# Patient Record
Sex: Female | Born: 1935 | Race: White | Hispanic: No | Smoking: Never smoker
Health system: Southern US, Community
[De-identification: ages and names within clinical notes are randomized; demographics above are authoritative.]

## PROBLEM LIST (undated history)

## (undated) DIAGNOSIS — F0280 Dementia in other diseases classified elsewhere without behavioral disturbance: Secondary | ICD-10-CM

## (undated) DIAGNOSIS — S32509A Unspecified fracture of unspecified pubis, initial encounter for closed fracture: Secondary | ICD-10-CM

## (undated) DIAGNOSIS — N390 Urinary tract infection, site not specified: Secondary | ICD-10-CM

## (undated) DIAGNOSIS — S43006A Unspecified dislocation of unspecified shoulder joint, initial encounter: Secondary | ICD-10-CM

## (undated) DIAGNOSIS — G309 Alzheimer's disease, unspecified: Secondary | ICD-10-CM

## (undated) DIAGNOSIS — E079 Disorder of thyroid, unspecified: Secondary | ICD-10-CM

## (undated) HISTORY — PX: CHOLECYSTECTOMY: SHX55

---

## 2000-06-18 ENCOUNTER — Emergency Department (HOSPITAL_COMMUNITY): Admission: EM | Admit: 2000-06-18 | Discharge: 2000-06-18 | Payer: Self-pay | Admitting: Emergency Medicine

## 2000-06-18 ENCOUNTER — Encounter: Payer: Self-pay | Admitting: Emergency Medicine

## 2002-04-03 ENCOUNTER — Emergency Department (HOSPITAL_COMMUNITY): Admission: EM | Admit: 2002-04-03 | Discharge: 2002-04-03 | Payer: Self-pay | Admitting: Emergency Medicine

## 2002-04-03 ENCOUNTER — Encounter: Payer: Self-pay | Admitting: Emergency Medicine

## 2003-11-25 ENCOUNTER — Emergency Department (HOSPITAL_COMMUNITY): Admission: EM | Admit: 2003-11-25 | Discharge: 2003-11-25 | Payer: Self-pay | Admitting: Emergency Medicine

## 2004-06-26 ENCOUNTER — Emergency Department (HOSPITAL_COMMUNITY): Admission: EM | Admit: 2004-06-26 | Discharge: 2004-06-26 | Payer: Self-pay | Admitting: Emergency Medicine

## 2009-11-08 ENCOUNTER — Emergency Department (HOSPITAL_BASED_OUTPATIENT_CLINIC_OR_DEPARTMENT_OTHER): Admission: EM | Admit: 2009-11-08 | Discharge: 2009-11-08 | Payer: Self-pay | Admitting: Emergency Medicine

## 2009-11-08 ENCOUNTER — Ambulatory Visit: Payer: Self-pay | Admitting: Radiology

## 2012-01-12 ENCOUNTER — Emergency Department (HOSPITAL_BASED_OUTPATIENT_CLINIC_OR_DEPARTMENT_OTHER): Payer: Medicare Other

## 2012-01-12 ENCOUNTER — Emergency Department (HOSPITAL_BASED_OUTPATIENT_CLINIC_OR_DEPARTMENT_OTHER)
Admission: EM | Admit: 2012-01-12 | Discharge: 2012-01-12 | Disposition: A | Discharge status: Home / Self Care | Payer: Medicare Other | Attending: Emergency Medicine | Admitting: Emergency Medicine

## 2012-01-12 ENCOUNTER — Encounter (HOSPITAL_BASED_OUTPATIENT_CLINIC_OR_DEPARTMENT_OTHER): Payer: Self-pay

## 2012-01-12 DIAGNOSIS — G309 Alzheimer's disease, unspecified: Secondary | ICD-10-CM | POA: Insufficient documentation

## 2012-01-12 DIAGNOSIS — T148XXA Other injury of unspecified body region, initial encounter: Secondary | ICD-10-CM | POA: Insufficient documentation

## 2012-01-12 DIAGNOSIS — Y9301 Activity, walking, marching and hiking: Secondary | ICD-10-CM | POA: Insufficient documentation

## 2012-01-12 DIAGNOSIS — W1789XA Other fall from one level to another, initial encounter: Secondary | ICD-10-CM | POA: Insufficient documentation

## 2012-01-12 DIAGNOSIS — S0993XA Unspecified injury of face, initial encounter: Secondary | ICD-10-CM | POA: Insufficient documentation

## 2012-01-12 DIAGNOSIS — S8990XA Unspecified injury of unspecified lower leg, initial encounter: Secondary | ICD-10-CM | POA: Insufficient documentation

## 2012-01-12 DIAGNOSIS — W19XXXA Unspecified fall, initial encounter: Secondary | ICD-10-CM

## 2012-01-12 DIAGNOSIS — Z79899 Other long term (current) drug therapy: Secondary | ICD-10-CM | POA: Insufficient documentation

## 2012-01-12 DIAGNOSIS — Y929 Unspecified place or not applicable: Secondary | ICD-10-CM | POA: Insufficient documentation

## 2012-01-12 DIAGNOSIS — F0280 Dementia in other diseases classified elsewhere without behavioral disturbance: Secondary | ICD-10-CM | POA: Insufficient documentation

## 2012-01-12 DIAGNOSIS — S0990XA Unspecified injury of head, initial encounter: Secondary | ICD-10-CM | POA: Insufficient documentation

## 2012-01-12 DIAGNOSIS — S199XXA Unspecified injury of neck, initial encounter: Secondary | ICD-10-CM | POA: Insufficient documentation

## 2012-01-12 DIAGNOSIS — Z87440 Personal history of urinary (tract) infections: Secondary | ICD-10-CM | POA: Insufficient documentation

## 2012-01-12 HISTORY — DX: Disorder of thyroid, unspecified: E07.9

## 2012-01-12 HISTORY — DX: Urinary tract infection, site not specified: N39.0

## 2012-01-12 HISTORY — DX: Dementia in other diseases classified elsewhere, unspecified severity, without behavioral disturbance, psychotic disturbance, mood disturbance, and anxiety: F02.80

## 2012-01-12 HISTORY — DX: Alzheimer's disease, unspecified: G30.9

## 2012-01-12 MED ORDER — ACETAMINOPHEN 500 MG PO TABS: 500.0000 mg | ORAL_TABLET | Freq: Four times a day (QID) | ORAL | Status: DC | PRN

## 2012-01-12 MED ORDER — ACETAMINOPHEN 325 MG PO TABS
650.0000 mg | ORAL_TABLET | Freq: Once | ORAL | Status: AC
  Administered 2012-01-12: 650 mg via ORAL
  Filled 2012-01-12: qty 2

## 2012-01-12 MED ORDER — IBUPROFEN 400 MG PO TABS: 400.0000 mg | ORAL_TABLET | Freq: Four times a day (QID) | ORAL | Status: DC | PRN

## 2012-01-12 MED ORDER — IBUPROFEN 400 MG PO TABS
400.0000 mg | ORAL_TABLET | Freq: Once | ORAL | Status: AC
  Administered 2012-01-12: 400 mg via ORAL
  Filled 2012-01-12: qty 1

## 2012-01-12 NOTE — ED Notes (Signed)
Pt fell while ambulating and now reports neck and back pain.

## 2012-01-12 NOTE — ED Notes (Signed)
MD at bedside. 

## 2012-01-12 NOTE — ED Notes (Signed)
Called Ptar back and cancelled pick--family is taking her back to [facility]

## 2012-01-12 NOTE — ED Notes (Signed)
Patient transported to X-ray via stretcher 

## 2012-01-12 NOTE — ED Provider Notes (Signed)
History     CSN: [id number]  Arrival date & time 01/12/12  1320   First MD Initiated Contact with Patient 01/12/12 1324      Chief Complaint  Patient presents with  . Fall  . Back Pain  . Neck Pain    (Consider location/radiation/quality/duration/timing/severity/associated sxs/prior treatment) HPI Comments: Patient was walking out of the lunch room and states she slipped on a slick floor and fell backwards  Patient is a 78 y.o. female presenting with fall, back pain, and neck pain. The history is provided by the patient.  Fall The accident occurred less than 1 hour ago. The fall occurred while walking. She fell from a height of 1 to 2 ft. She landed on a hard floor. There was no blood loss. The point of impact was the head, neck and right knee. The pain is present in the neck and right knee. The pain is at a severity of 6/10. The pain is moderate. She was not ambulatory at the scene. Pertinent negatives include no numbness, no abdominal pain, no bowel incontinence, no nausea, no headaches and no loss of consciousness. The symptoms are aggravated by activity and use of the injured limb. She has tried nothing for the symptoms. The treatment provided no relief.  Back Pain  Pertinent negatives include no numbness, no headaches, no abdominal pain and no bowel incontinence.  Neck Pain  Pertinent negatives include no numbness, no headaches and no bowel incontinence.    Past Medical History  Diagnosis Date  . Alzheimer's dementia   . UTI (urinary tract infection)   . Thyroid disease     No past surgical history on file.  No family history on file.  History  Substance Use Topics  . Smoking status: Unknown If Ever Smoked  . Smokeless tobacco: Not on file  . Alcohol Use: No    OB History    Grav Para Term Preterm Abortions TAB SAB Ect Mult Living                  Review of Systems  HENT: Positive for neck pain.   Gastrointestinal: Negative for nausea, abdominal pain and  bowel incontinence.  Musculoskeletal: Positive for back pain.  Neurological: Negative for loss of consciousness, numbness and headaches.  All other systems reviewed and are negative.    Allergies  Review of patient's allergies indicates no known allergies.  Home Medications   Current Outpatient Rx  Name  Route  Sig  Dispense  Refill  . BRIMONIDINE TARTRATE-TIMOLOL 0.2-0.5 % OP SOLN   Both Eyes   Place 1 drop into both eyes every 12 (twelve) hours.         Marland Kitchen LAMOTRIGINE 100 MG PO TABS   Oral   Take 100 mg by mouth 2 (two) times daily.         Marland Kitchen LEVOTHYROXINE SODIUM 25 MCG PO TABS   Oral   Take 25 mcg by mouth daily.         Marland Kitchen MEMANTINE HCL 10 MG PO TABS   Oral   Take 10 mg by mouth 2 (two) times daily.         Latina Craver SENIOR/ANTIOXIDANT PO TABS   Oral   Take by mouth.         Marland Kitchen POLYETHYLENE GLYCOL 3350 PO PACK   Oral   Take 17 g by mouth daily.         Marland Kitchen VITAMIN B-12 1000 MCG PO TABS   Oral  Take 1,000 mcg by mouth daily.           BP 128/57  Pulse 70  Temp 98.3 F (36.8 C) (Oral)  Resp 18  SpO2 97%  Physical Exam  Nursing note and vitals reviewed. Constitutional: She is oriented to person, place, and time. She appears well-developed and well-nourished. She appears distressed.  HENT:  Head: Normocephalic and atraumatic.  Mouth/Throat: Oropharynx is clear and moist.  Eyes: Conjunctivae normal and EOM are normal. Pupils are equal, round, and reactive to light.  Neck: Normal range of motion. Neck supple. Spinous process tenderness and muscular tenderness present.    Cardiovascular: Normal rate, regular rhythm and intact distal pulses.   No murmur heard. Pulmonary/Chest: Effort normal and breath sounds normal. No respiratory distress. She has no wheezes. She has no rales.  Abdominal: Soft. She exhibits no distension. There is no tenderness. There is no rebound and no guarding.  Musculoskeletal: Normal range of motion. She exhibits no  edema and no tenderness.       Right shoulder: Normal.       Left shoulder: Normal.       Right hip: Normal.       Left hip: Normal.       Right knee: She exhibits bony tenderness. She exhibits normal range of motion, no swelling, no effusion, no ecchymosis and no deformity. tenderness found. Medial joint line tenderness noted.       Left knee: Normal.       Right ankle: Normal.       Left ankle: Normal.       Thoracic back: She exhibits tenderness and bony tenderness. She exhibits normal range of motion, no spasm and normal pulse.       Lumbar back: Normal.  Neurological: She is alert and oriented to person, place, and time.  Skin: Skin is warm and dry. No rash noted. No erythema.  Psychiatric: She has a normal mood and affect. Her behavior is normal.    ED Course  Procedures (including critical care time)  Labs Reviewed - No data to display Dg Thoracic Spine W/swimmers  01/12/2012  *RADIOLOGY REPORT*  Clinical Data: Neck and back pain post fall  THORACIC SPINE - 2 VIEW + SWIMMERS  Comparison: Chest radiograph 11/25/2003  Findings: 12 pairs of ribs. Severe osseous demineralization. Mild broad-based levoconvex thoracolumbar scoliosis. Vertebral body and disc space heights grossly maintained. No definite fracture, subluxation or bone destruction. Upper thoracic spine and cervicothoracic junction are suboptimally visualized on lateral and swimmers views due to degree of osseous demineralization.  IMPRESSION: Osseous demineralization. No acute osseous findings.   Original Report Authenticated By: Ulyses Southward, M.D.    Ct Head Wo Contrast  01/12/2012  *RADIOLOGY REPORT*  Clinical Data:  Fall.  Head neck and back pain.  CT HEAD WITHOUT CONTRAST CT CERVICAL SPINE WITHOUT CONTRAST  Technique:  Multidetector CT imaging of the head and cervical spine was performed following the standard protocol without intravenous contrast.  Multiplanar CT image reconstructions of the cervical spine were also  generated.  Comparison:  CT of the head 11/25/2003.  CT HEAD  Findings: Mild periventricular white matter hypoattenuation has progressed since the prior exam.  No acute cortical infarct, hemorrhage, or mass lesion is present.  The ventricles are of normal size.  The ventricles are proportionate to the degree of atrophy.  No significant extra-axial fluid collection is present. The paranasal sinuses and mastoid air cells are clear.  The osseous skull is intact.  No  significant extracranial soft tissue injury is evident.  IMPRESSION:  1.  Interval progression of mild atrophy and white matter disease. This likely reflects the sequelae of chronic microvascular ischemia. 2.  No acute intracranial abnormality. 3.  No evidence for acute trauma.  CT CERVICAL SPINE  Findings: The cervical spine is imaged from skull base through T1- 2.  There is no body heights are maintained.  Slight degenerative anterolisthesis is present at C3-4.  Posterior elements are fused. There is ankylosis across the disc space at C4-5 and laterally at C5-6.  Posterior elements are fused at C4-5 on the right.  Mild straightening of the normal cervical lordosis is evident.  No acute fracture or traumatic subluxation is present.  Mild heterogeneity is present in the thyroid without a discrete lesion. The soft tissues of the neck are unremarkable.  IMPRESSION:  1.  Multilevel spondylosis of the cervical spine. 2.  No acute fracture or traumatic subluxation. 3.  Ankylosis at C3-4, C4-5, and C5-6.   Original Report Authenticated By: Marin Roberts, M.D.    Ct Cervical Spine Wo Contrast  01/12/2012  *RADIOLOGY REPORT*  Clinical Data:  Fall.  Head neck and back pain.  CT HEAD WITHOUT CONTRAST CT CERVICAL SPINE WITHOUT CONTRAST  Technique:  Multidetector CT imaging of the head and cervical spine was performed following the standard protocol without intravenous contrast.  Multiplanar CT image reconstructions of the cervical spine were also generated.   Comparison:  CT of the head 11/25/2003.  CT HEAD  Findings: Mild periventricular white matter hypoattenuation has progressed since the prior exam.  No acute cortical infarct, hemorrhage, or mass lesion is present.  The ventricles are of normal size.  The ventricles are proportionate to the degree of atrophy.  No significant extra-axial fluid collection is present. The paranasal sinuses and mastoid air cells are clear.  The osseous skull is intact.  No significant extracranial soft tissue injury is evident.  IMPRESSION:  1.  Interval progression of mild atrophy and white matter disease. This likely reflects the sequelae of chronic microvascular ischemia. 2.  No acute intracranial abnormality. 3.  No evidence for acute trauma.  CT CERVICAL SPINE  Findings: The cervical spine is imaged from skull base through T1- 2.  There is no body heights are maintained.  Slight degenerative anterolisthesis is present at C3-4.  Posterior elements are fused. There is ankylosis across the disc space at C4-5 and laterally at C5-6.  Posterior elements are fused at C4-5 on the right.  Mild straightening of the normal cervical lordosis is evident.  No acute fracture or traumatic subluxation is present.  Mild heterogeneity is present in the thyroid without a discrete lesion. The soft tissues of the neck are unremarkable.  IMPRESSION:  1.  Multilevel spondylosis of the cervical spine. 2.  No acute fracture or traumatic subluxation. 3.  Ankylosis at C3-4, C4-5, and C5-6.   Original Report Authenticated By: Marin Roberts, M.D.    Dg Knee Complete 4 Views Right  01/12/2012  *RADIOLOGY REPORT*  Clinical Data: Right side pain post fall  RIGHT KNEE - COMPLETE 4+ VIEW  Comparison: None  Findings: Osseous demineralization. Minimal medial compartment joint space narrowing. No acute fracture, dislocation or bone destruction. Minimal knee joint effusion.  IMPRESSION: Osseous demineralization. No acute abnormalities.   Original Report  Authenticated By: Ulyses Southward, M.D.      1. Fall   2. Contusion       MDM   Patient with a mechanical fall today where  she fell backwards hitting her head, neck, upper back and right knee. She denied LOC and is on any anticoagulations. She has palpable C-spine tenderness and thoracic tenderness on exam. She has no chest or abdominal tenderness. Full range of motion of bilateral shoulders, hips, knees and ankles. CT of the head and C-spine ordered. Thoracic and right knee films pending. Patient given ibuprofen and Tylenol for pain and she has a sensitivity to narcotics.  2:47 PM Pt feeling better and films neg for acute pathology.  Pt d/ced home to use tylenol and motrin prn.      Gwyneth Sprout, MD 01/12/12 1447

## 2012-04-20 ENCOUNTER — Emergency Department (HOSPITAL_BASED_OUTPATIENT_CLINIC_OR_DEPARTMENT_OTHER): Payer: Medicare Other

## 2012-04-20 ENCOUNTER — Emergency Department (HOSPITAL_BASED_OUTPATIENT_CLINIC_OR_DEPARTMENT_OTHER)
Admission: EM | Admit: 2012-04-20 | Discharge: 2012-04-20 | Disposition: A | Discharge status: Home / Self Care | Payer: Medicare Other | Attending: Emergency Medicine | Admitting: Emergency Medicine

## 2012-04-20 ENCOUNTER — Encounter (HOSPITAL_BASED_OUTPATIENT_CLINIC_OR_DEPARTMENT_OTHER): Payer: Self-pay | Admitting: Emergency Medicine

## 2012-04-20 DIAGNOSIS — G309 Alzheimer's disease, unspecified: Secondary | ICD-10-CM | POA: Insufficient documentation

## 2012-04-20 DIAGNOSIS — E079 Disorder of thyroid, unspecified: Secondary | ICD-10-CM | POA: Insufficient documentation

## 2012-04-20 DIAGNOSIS — S0990XA Unspecified injury of head, initial encounter: Secondary | ICD-10-CM | POA: Insufficient documentation

## 2012-04-20 DIAGNOSIS — S161XXA Strain of muscle, fascia and tendon at neck level, initial encounter: Secondary | ICD-10-CM

## 2012-04-20 DIAGNOSIS — Z87440 Personal history of urinary (tract) infections: Secondary | ICD-10-CM | POA: Insufficient documentation

## 2012-04-20 DIAGNOSIS — W010XXA Fall on same level from slipping, tripping and stumbling without subsequent striking against object, initial encounter: Secondary | ICD-10-CM | POA: Insufficient documentation

## 2012-04-20 DIAGNOSIS — Z79899 Other long term (current) drug therapy: Secondary | ICD-10-CM | POA: Insufficient documentation

## 2012-04-20 DIAGNOSIS — S139XXA Sprain of joints and ligaments of unspecified parts of neck, initial encounter: Secondary | ICD-10-CM | POA: Insufficient documentation

## 2012-04-20 DIAGNOSIS — F0280 Dementia in other diseases classified elsewhere without behavioral disturbance: Secondary | ICD-10-CM | POA: Insufficient documentation

## 2012-04-20 DIAGNOSIS — Y9301 Activity, walking, marching and hiking: Secondary | ICD-10-CM | POA: Insufficient documentation

## 2012-04-20 DIAGNOSIS — S40019A Contusion of unspecified shoulder, initial encounter: Secondary | ICD-10-CM | POA: Insufficient documentation

## 2012-04-20 DIAGNOSIS — S8000XA Contusion of unspecified knee, initial encounter: Secondary | ICD-10-CM | POA: Insufficient documentation

## 2012-04-20 DIAGNOSIS — W19XXXA Unspecified fall, initial encounter: Secondary | ICD-10-CM

## 2012-04-20 DIAGNOSIS — F0281 Dementia in other diseases classified elsewhere with behavioral disturbance: Secondary | ICD-10-CM | POA: Insufficient documentation

## 2012-04-20 DIAGNOSIS — F02818 Dementia in other diseases classified elsewhere, unspecified severity, with other behavioral disturbance: Secondary | ICD-10-CM | POA: Insufficient documentation

## 2012-04-20 DIAGNOSIS — Y9210 Unspecified residential institution as the place of occurrence of the external cause: Secondary | ICD-10-CM | POA: Insufficient documentation

## 2012-04-20 MED ORDER — ONDANSETRON 8 MG PO TBDP
8.0000 mg | ORAL_TABLET | Freq: Once | ORAL | Status: AC
  Administered 2012-04-20: 8 mg via ORAL
  Filled 2012-04-20: qty 1

## 2012-04-20 MED ORDER — HYDROCODONE-ACETAMINOPHEN 5-325 MG PO TABS: 1.0000 | ORAL_TABLET | ORAL | Status: DC | PRN

## 2012-04-20 NOTE — ED Provider Notes (Addendum)
History     CSN: [id number]  Arrival date & time 04/20/12  1111   First MD Initiated Contact with Patient 04/20/12 1114      Chief Complaint  Patient presents with  . Fall    (Consider location/radiation/quality/duration/timing/severity/associated sxs/prior treatment) HPI Comments: Patient was walking at [facility] with walker when she tripped and fell.  She was apparently found on the floor with her head against the wall.  She has a history of dementia and contributes little reliable information.    Patient is a 79 y.o. female presenting with fall. The history is provided by the patient.  Fall The accident occurred less than 1 hour ago. The fall occurred while walking. She fell from a height of 1 to 2 ft. She landed on a hard floor. There was no blood loss. The point of impact was the head and neck. The pain is present in the head and neck. The pain is moderate. She was not ambulatory at the scene. Pertinent negatives include no visual change, no abdominal pain, no loss of consciousness and no tingling. The symptoms are aggravated by activity. Treatment on scene includes a c-collar and a backboard. She has tried nothing for the symptoms.    Past Medical History  Diagnosis Date  . Alzheimer's dementia   . UTI (urinary tract infection)   . Thyroid disease     History reviewed. No pertinent past surgical history.  History reviewed. No pertinent family history.  History  Substance Use Topics  . Smoking status: Unknown If Ever Smoked  . Smokeless tobacco: Not on file  . Alcohol Use: No    OB History   Grav Para Term Preterm Abortions TAB SAB Ect Mult Living                  Review of Systems  Gastrointestinal: Negative for abdominal pain.  Neurological: Negative for tingling and loss of consciousness.  All other systems reviewed and are negative.    Allergies  Review of patient's allergies indicates no known allergies.  Home Medications   Current Outpatient Rx  Name   Route  Sig  Dispense  Refill  . acetaminophen (TYLENOL) 500 MG tablet   Oral   Take 1 tablet (500 mg total) by mouth every 6 (six) hours as needed for pain.   30 tablet   0   . brimonidine-timolol (COMBIGAN) 0.2-0.5 % ophthalmic solution   Both Eyes   Place 1 drop into both eyes every 12 (twelve) hours.         Marland Kitchen ibuprofen (ADVIL,MOTRIN) 400 MG tablet   Oral   Take 1 tablet (400 mg total) by mouth every 6 (six) hours as needed for pain.   30 tablet   0   . lamoTRIgine (LAMICTAL) 100 MG tablet   Oral   Take 100 mg by mouth 2 (two) times daily.         Marland Kitchen levothyroxine (SYNTHROID, LEVOTHROID) 25 MCG tablet   Oral   Take 25 mcg by mouth daily.         . memantine (NAMENDA) 10 MG tablet   Oral   Take 10 mg by mouth 2 (two) times daily.         . Multiple Vitamins-Minerals (CERTAVITE SENIOR/ANTIOXIDANT) TABS   Oral   Take by mouth.         . polyethylene glycol (MIRALAX / GLYCOLAX) packet   Oral   Take 17 g by mouth daily.         Marland Kitchen  vitamin B-12 (CYANOCOBALAMIN) 1000 MCG tablet   Oral   Take 1,000 mcg by mouth daily.           BP 155/69  Pulse 67  Temp(Src) 98.4 F (36.9 C) (Oral)  Resp 18  SpO2 97%  Physical Exam  Nursing note and vitals reviewed. Constitutional: She appears well-developed and well-nourished.  Elderly female in no acute distress.  She is somewhat disoriented, but responds appropriately to questions and follows commands.  HENT:  Head: Normocephalic and atraumatic.  Mouth/Throat: Oropharynx is clear and moist.  Eyes: EOM are normal. Pupils are equal, round, and reactive to light.  Neck: Normal range of motion. Neck supple.  Cardiovascular: Normal rate and regular rhythm.   No murmur heard. Pulmonary/Chest: Effort normal and breath sounds normal. No respiratory distress. She has no wheezes.  Abdominal: Soft. Bowel sounds are normal. She exhibits no distension. There is no tenderness.  Musculoskeletal: Normal range of motion. She  exhibits no edema.  There is mild swelling, ttp over the left lateral malleolus.  Neurological: She is alert. No cranial nerve deficit. She exhibits normal muscle tone. Coordination normal.  Skin: Skin is warm and dry.    ED Course  Procedures (including critical care time)  Labs Reviewed - No data to display No results found.   No diagnosis found.    MDM  The xrays and cts do not show a fracture or intracranial injury.  Will discharge to home with pain meds, follow up prn.  There were findings of an old left humerus fracture with subluxation of the left shoulder.  I discussed this with the daughter at beside.  She is aware of this injury and that her mother had refused any treatment on it in the past.          Geoffery Lyons, MD 04/20/12 1353  Geoffery Lyons, MD 04/20/12 [phone]

## 2012-04-20 NOTE — ED Notes (Signed)
MD at bedside. 

## 2012-04-20 NOTE — ED Notes (Signed)
Per EMS:  Pt had un-witnessed fall at nursing facility.  Pt using walker but fell.  C/o pain in sacral area and bilateral knees.  Pt has chronic shoulder pain.  No neuro deficits.  Some left ankle pain.

## 2012-04-20 NOTE — ED Notes (Addendum)
Patient states that she is having some nausea. Dr Judd Lien made aware. Zofran ODT given.

## 2012-05-27 ENCOUNTER — Ambulatory Visit: Payer: Self-pay | Admitting: Podiatry

## 2012-09-13 ENCOUNTER — Encounter (HOSPITAL_BASED_OUTPATIENT_CLINIC_OR_DEPARTMENT_OTHER): Payer: Self-pay | Admitting: Emergency Medicine

## 2012-09-13 ENCOUNTER — Emergency Department (HOSPITAL_BASED_OUTPATIENT_CLINIC_OR_DEPARTMENT_OTHER)
Admission: EM | Admit: 2012-09-13 | Discharge: 2012-09-14 | Disposition: A | Discharge status: Home / Self Care | Payer: Medicare Other | Attending: Emergency Medicine | Admitting: Emergency Medicine

## 2012-09-13 ENCOUNTER — Emergency Department (HOSPITAL_BASED_OUTPATIENT_CLINIC_OR_DEPARTMENT_OTHER): Payer: Medicare Other

## 2012-09-13 DIAGNOSIS — Y9210 Unspecified residential institution as the place of occurrence of the external cause: Secondary | ICD-10-CM | POA: Insufficient documentation

## 2012-09-13 DIAGNOSIS — Y9301 Activity, walking, marching and hiking: Secondary | ICD-10-CM | POA: Insufficient documentation

## 2012-09-13 DIAGNOSIS — Z87440 Personal history of urinary (tract) infections: Secondary | ICD-10-CM | POA: Insufficient documentation

## 2012-09-13 DIAGNOSIS — IMO0002 Reserved for concepts with insufficient information to code with codable children: Secondary | ICD-10-CM | POA: Insufficient documentation

## 2012-09-13 DIAGNOSIS — G309 Alzheimer's disease, unspecified: Secondary | ICD-10-CM | POA: Insufficient documentation

## 2012-09-13 DIAGNOSIS — S59909A Unspecified injury of unspecified elbow, initial encounter: Secondary | ICD-10-CM | POA: Insufficient documentation

## 2012-09-13 DIAGNOSIS — E079 Disorder of thyroid, unspecified: Secondary | ICD-10-CM | POA: Insufficient documentation

## 2012-09-13 DIAGNOSIS — S6990XA Unspecified injury of unspecified wrist, hand and finger(s), initial encounter: Secondary | ICD-10-CM | POA: Insufficient documentation

## 2012-09-13 DIAGNOSIS — Z79899 Other long term (current) drug therapy: Secondary | ICD-10-CM | POA: Insufficient documentation

## 2012-09-13 DIAGNOSIS — R296 Repeated falls: Secondary | ICD-10-CM | POA: Insufficient documentation

## 2012-09-13 DIAGNOSIS — Y92009 Unspecified place in unspecified non-institutional (private) residence as the place of occurrence of the external cause: Secondary | ICD-10-CM

## 2012-09-13 DIAGNOSIS — F0280 Dementia in other diseases classified elsewhere without behavioral disturbance: Secondary | ICD-10-CM | POA: Insufficient documentation

## 2012-09-13 DIAGNOSIS — S59919A Unspecified injury of unspecified forearm, initial encounter: Secondary | ICD-10-CM | POA: Insufficient documentation

## 2012-09-13 MED ORDER — HYDROCODONE-ACETAMINOPHEN 5-325 MG PO TABS: 1.0000 | ORAL_TABLET | Freq: Four times a day (QID) | ORAL | Status: DC | PRN

## 2012-09-13 MED ORDER — HYDROCODONE-ACETAMINOPHEN 5-325 MG PO TABS
1.0000 | ORAL_TABLET | Freq: Once | ORAL | Status: AC
  Administered 2012-09-13: 1 via ORAL
  Filled 2012-09-13: qty 1

## 2012-09-13 NOTE — ED Notes (Signed)
[facility] of patients return, diagnosis and informed of prescription written for patient. Nurse at facility returned acknowledgement of information, ptar notify of need for tx

## 2012-09-13 NOTE — ED Provider Notes (Addendum)
History    CSN: [id number] Arrival date & time 09/13/12  2139  First MD Initiated Contact with Patient 09/13/12 2144     Chief Complaint  Patient presents with  . Fall   (Consider location/radiation/quality/duration/timing/severity/associated sxs/prior Treatment) Patient is a 79 y.o. female presenting with fall. The history is provided by the nursing home. The history is limited by the absence of a caregiver.  Fall This is a new problem. The current episode started less than 1 hour ago. The problem occurs constantly. The problem has not changed since onset.Associated symptoms comments: Was walking from the bed to the bathroom and fell.  EMS placed in c-spine precautions, and fully immobilized.  Pt c/o of pain everywhere and not reliable.  Nursing home denies LoC or change in baseline.  They reported did not appear to have injured her head.  She intially there was c/o of back and left arm pain.. The symptoms are aggravated by bending, twisting and standing. Nothing relieves the symptoms. She has tried nothing for the symptoms. The treatment provided no relief.   Past Medical History  Diagnosis Date  . Alzheimer's dementia   . UTI (urinary tract infection)   . Thyroid disease    History reviewed. No pertinent past surgical history. History reviewed. No pertinent family history. History  Substance Use Topics  . Smoking status: Unknown If Ever Smoked  . Smokeless tobacco: Not on file  . Alcohol Use: No   OB History   Grav Para Term Preterm Abortions TAB SAB Ect Mult Living                 Review of Systems  Unable to perform ROS   Allergies  Review of patient's allergies indicates no known allergies.  Home Medications   Current Outpatient Rx  Name  Route  Sig  Dispense  Refill  . acetaminophen (TYLENOL) 500 MG tablet   Oral   Take 1 tablet (500 mg total) by mouth every 6 (six) hours as needed for pain.   30 tablet   0   . fluticasone (FLONASE) 50 MCG/ACT nasal  spray   Nasal   Place 2 sprays into the nose daily.         Marland Kitchen lamoTRIgine (LAMICTAL) 100 MG tablet   Oral   Take 100 mg by mouth 2 (two) times daily.         Marland Kitchen levothyroxine (SYNTHROID, LEVOTHROID) 25 MCG tablet   Oral   Take 25 mcg by mouth daily.         . memantine (NAMENDA) 10 MG tablet   Oral   Take 10 mg by mouth 2 (two) times daily.         . Multiple Vitamins-Minerals (CERTAVITE SENIOR/ANTIOXIDANT) TABS   Oral   Take by mouth.         . polyethylene glycol (MIRALAX / GLYCOLAX) packet   Oral   Take 17 g by mouth daily.         Marland Kitchen triamcinolone cream (KENALOG) 0.1 %   Topical   Apply 1 application topically 2 (two) times daily.         . vitamin B-12 (CYANOCOBALAMIN) 1000 MCG tablet   Oral   Take 1,000 mcg by mouth daily.         . brimonidine-timolol (COMBIGAN) 0.2-0.5 % ophthalmic solution   Both Eyes   Place 1 drop into both eyes every 12 (twelve) hours.         Marland Kitchen HYDROcodone-acetaminophen (NORCO)  5-325 MG per tablet   Oral   Take 1 tablet by mouth every 4 (four) hours as needed for pain.   20 tablet   0   . ibuprofen (ADVIL,MOTRIN) 400 MG tablet   Oral   Take 1 tablet (400 mg total) by mouth every 6 (six) hours as needed for pain.   30 tablet   0    BP 147/67  Pulse 76  Temp(Src) 98 F (36.7 C) (Oral)  Resp 18  SpO2 96% Physical Exam  Nursing note and vitals reviewed. Constitutional: She is oriented to person, place, and time. She appears well-developed and well-nourished. She appears distressed.  Distressed and moaning saying everything hurts  HENT:  Head: Normocephalic and atraumatic.  Mouth/Throat: Oropharynx is clear and moist.  Eyes: Conjunctivae and EOM are normal. Pupils are equal, round, and reactive to light.  Neck: Normal range of motion. Neck supple. No spinous process tenderness and no muscular tenderness present. Normal range of motion present.  In c-spine immobilization without focal tenderness  Cardiovascular:  Normal rate, regular rhythm and intact distal pulses.   No murmur heard. Pulmonary/Chest: Effort normal and breath sounds normal. No respiratory distress. She has no wheezes. She has no rales. She exhibits no tenderness.  Abdominal: Soft. She exhibits no distension. There is no tenderness. There is no rebound and no guarding.  Musculoskeletal: Normal range of motion. She exhibits edema. She exhibits no tenderness.       Right wrist: Normal.       Left wrist: Normal.       Right hip: Normal.       Left hip: Normal.       Right knee: Normal.       Left knee: Normal.  2+ bilateral pitting edema  Neurological: She is alert and oriented to person, place, and time.  Skin: Skin is warm and dry. No rash noted. No erythema.  Psychiatric: She has a normal mood and affect. Her behavior is normal.    ED Course  Procedures (including critical care time) Labs Reviewed - No data to display Dg Chest 2 View  09/13/2012   *RADIOLOGY REPORT*  Clinical Data: Fall with injury.  CHEST - 2 VIEW  Comparison: 04/20/2012  Findings: Old fracture of the left proximal humerus is stable.  No evidence of an acute fracture.  The cardiac silhouette is mildly enlarged.  No mediastinal or hilar masses.  There are prominent bronchovascular markings bilaterally and mild interstitial thickening that is increased when compared to prior studies.  This may reflect mild pulmonary edema.  No evidence of an infiltrate.  No pneumothorax or pleural effusion.  IMPRESSION:  Prominent bronchovascular markings and mild interstitial thickening greater than seen on the prior studies.  If there are shortness of breath, mild pulmonary edema should be considered likely.  No evidence of an infiltrate.   Original Report Authenticated By: Amie Portland, M.D.   1. Fall at home, initial encounter     MDM   Patient with a mechanical fall at the nursing home when she was getting from the bed to the bathroom. She denies any head pain or neck pain on  exam however when moving extremities she states everything hurts however with distraction able to complete the range bilateral hips, knees and ankles without pain. Patient able to move both arms without pain. No focal normal tenderness and no palpable tenderness to C., T. or L-spine. No focal areas of injury. No ecchymosis. And after Vicodin patient feels much  better. Chest x-ray done due to pain in her chest when she sat up which showed no acute signs of trauma. Because patient has no other focal exam findings do not feel she needs further imaging at this time. We'll discharge back to nursing facility with pain control.  Gwyneth Sprout, MD 09/13/12 1610  Gwyneth Sprout, MD 09/13/12 [phone]

## 2012-09-13 NOTE — ED Notes (Signed)
[facility] patients daughter who states that patien thad a bad left arm, informed pt's daughter that patient is currently resting not c/o any pain, if diagnostic testing is negative then patient will more than likely return to facility, informed daughter that if things change I would notify her via phone

## 2012-09-13 NOTE — ED Notes (Signed)
Pt recent history of cellulitis to left arm and bilateral le's, pt completed dosage of keflex and currently on kenalog cream to extremeties

## 2012-09-13 NOTE — ED Notes (Signed)
Pt was attempting to get to bed from bathroom, originally c/o lower back pain, left arm pain , placed on backboard and cspine immobiliation via ems, pt with history of alzheimers unable to answer questions, answers yes to every question asked

## 2012-09-14 NOTE — ED Notes (Signed)
Pt belognings sent with patient, shirt, paints, and neclace sent with patient

## 2012-10-14 ENCOUNTER — Ambulatory Visit: Payer: Self-pay | Admitting: Podiatry

## 2013-05-14 ENCOUNTER — Emergency Department (HOSPITAL_BASED_OUTPATIENT_CLINIC_OR_DEPARTMENT_OTHER)
Admission: EM | Admit: 2013-05-14 | Discharge: 2013-05-14 | Disposition: A | Discharge status: Home / Self Care | Payer: Medicare Other | Attending: Emergency Medicine | Admitting: Emergency Medicine

## 2013-05-14 ENCOUNTER — Emergency Department (HOSPITAL_BASED_OUTPATIENT_CLINIC_OR_DEPARTMENT_OTHER): Payer: Medicare Other

## 2013-05-14 ENCOUNTER — Encounter (HOSPITAL_BASED_OUTPATIENT_CLINIC_OR_DEPARTMENT_OTHER): Payer: Self-pay | Admitting: Emergency Medicine

## 2013-05-14 DIAGNOSIS — IMO0002 Reserved for concepts with insufficient information to code with codable children: Secondary | ICD-10-CM | POA: Insufficient documentation

## 2013-05-14 DIAGNOSIS — Z87440 Personal history of urinary (tract) infections: Secondary | ICD-10-CM | POA: Insufficient documentation

## 2013-05-14 DIAGNOSIS — E079 Disorder of thyroid, unspecified: Secondary | ICD-10-CM | POA: Insufficient documentation

## 2013-05-14 DIAGNOSIS — M25519 Pain in unspecified shoulder: Secondary | ICD-10-CM | POA: Insufficient documentation

## 2013-05-14 DIAGNOSIS — Y9389 Activity, other specified: Secondary | ICD-10-CM | POA: Insufficient documentation

## 2013-05-14 DIAGNOSIS — W19XXXA Unspecified fall, initial encounter: Secondary | ICD-10-CM

## 2013-05-14 DIAGNOSIS — F0280 Dementia in other diseases classified elsewhere without behavioral disturbance: Secondary | ICD-10-CM | POA: Insufficient documentation

## 2013-05-14 DIAGNOSIS — S298XXA Other specified injuries of thorax, initial encounter: Secondary | ICD-10-CM | POA: Insufficient documentation

## 2013-05-14 DIAGNOSIS — Y92129 Unspecified place in nursing home as the place of occurrence of the external cause: Secondary | ICD-10-CM

## 2013-05-14 DIAGNOSIS — G309 Alzheimer's disease, unspecified: Secondary | ICD-10-CM | POA: Insufficient documentation

## 2013-05-14 DIAGNOSIS — Z79899 Other long term (current) drug therapy: Secondary | ICD-10-CM | POA: Insufficient documentation

## 2013-05-14 DIAGNOSIS — W1809XA Striking against other object with subsequent fall, initial encounter: Secondary | ICD-10-CM | POA: Insufficient documentation

## 2013-05-14 DIAGNOSIS — G8929 Other chronic pain: Secondary | ICD-10-CM | POA: Insufficient documentation

## 2013-05-14 DIAGNOSIS — Y9210 Unspecified residential institution as the place of occurrence of the external cause: Secondary | ICD-10-CM | POA: Insufficient documentation

## 2013-05-14 DIAGNOSIS — S3981XA Other specified injuries of abdomen, initial encounter: Secondary | ICD-10-CM | POA: Insufficient documentation

## 2013-05-14 NOTE — ED Notes (Signed)
Patient transported to X-ray 

## 2013-05-14 NOTE — ED Notes (Signed)
[facility] for pt [facility].

## 2013-05-14 NOTE — ED Notes (Signed)
[facility]/o right flank pain that radiates to hip

## 2013-05-14 NOTE — ED Provider Notes (Signed)
CSN: [id number]     Arrival date & time 05/14/13  0401 History   First MD Initiated Contact with Patient 05/14/13 [phone]     Chief Complaint  Patient presents with  . Fall     (Consider location/radiation/quality/duration/timing/severity/associated sxs/prior Treatment) HPI This is an 80 year old female with Alzheimer's disease. She was ambulating at her assisted living facility this morning and fell laterally against the door. EMS reports she was complaining of right flank pain although currently the patient is unable to remember what happened or what she might have injured. There was no loss of consciousness. Symptoms appear to be mild. She has a history of chronic pain and tenderness in her left shoulder due to a remote injury  Past Medical History  Diagnosis Date  . Alzheimer's dementia   . UTI (urinary tract infection)   . Thyroid disease    History reviewed. No pertinent past surgical history. History reviewed. No pertinent family history. History  Substance Use Topics  . Smoking status: Unknown If Ever Smoked  . Smokeless tobacco: Not on file  . Alcohol Use: No   OB History   Grav Para Term Preterm Abortions TAB SAB Ect Mult Living                 Review of Systems  Unable to perform ROS     Allergies  Review of patient's allergies indicates no known allergies.  Home Medications   Current Outpatient Rx  Name  Route  Sig  Dispense  Refill  . acetaminophen (TYLENOL) 500 MG tablet   Oral   Take 1 tablet (500 mg total) by mouth every 6 (six) hours as needed for pain.   30 tablet   0   . fluticasone (FLONASE) 50 MCG/ACT nasal spray   Nasal   Place 2 sprays into the nose daily.         Marland Kitchen HYDROcodone-acetaminophen (NORCO) 5-325 MG per tablet   Oral   Take 1 tablet by mouth every 4 (four) hours as needed for pain.   20 tablet   0   . lamoTRIgine (LAMICTAL) 100 MG tablet   Oral   Take 100 mg by mouth 2 (two) times daily.         Marland Kitchen levothyroxine  (SYNTHROID, LEVOTHROID) 25 MCG tablet   Oral   Take 25 mcg by mouth daily.         . memantine (NAMENDA) 10 MG tablet   Oral   Take 10 mg by mouth 2 (two) times daily.         . polyethylene glycol (MIRALAX / GLYCOLAX) packet   Oral   Take 17 g by mouth daily.         Marland Kitchen triamcinolone cream (KENALOG) 0.1 %   Topical   Apply 1 application topically 2 (two) times daily.         . vitamin B-12 (CYANOCOBALAMIN) 1000 MCG tablet   Oral   Take 1,000 mcg by mouth daily.         . brimonidine-timolol (COMBIGAN) 0.2-0.5 % ophthalmic solution   Both Eyes   Place 1 drop into both eyes every 12 (twelve) hours.         Marland Kitchen HYDROcodone-acetaminophen (NORCO/VICODIN) 5-325 MG per tablet   Oral   Take 1 tablet by mouth every 6 (six) hours as needed for pain.   15 tablet   0   . ibuprofen (ADVIL,MOTRIN) 400 MG tablet   Oral   Take 1 tablet (400  mg total) by mouth every 6 (six) hours as needed for pain.   30 tablet   0   . Multiple Vitamins-Minerals (CERTAVITE SENIOR/ANTIOXIDANT) TABS   Oral   Take by mouth.          BP 141/60  Pulse 78  Temp(Src) 98.3 F (36.8 C) (Oral)  Resp 16  SpO2 99%  Physical Exam General: Well-developed, well-nourished female in no acute distress; appearance consistent with age of record HENT: normocephalic; atraumatic Eyes: pupils equal, round and reactive to light; extraocular muscles intact Neck: supple; equivocal tenderness C-spine Heart: regular rate and rhythm Lungs: clear to auscultation bilaterally Abdomen: soft; nondistended; nontender Back: Mild right inferior posterior rib tenderness without deformity or crepitus Extremities: Arthritic changes; no acute deformity; pain on passive range of motion of left shoulder Neurologic: Awake, alert, confused and disoriented; motor function intact in all extremities and symmetric; no facial droop Skin: Warm and dry     ED Course  Procedures (including critical care time)   MDM   Nursing notes and vitals signs, including pulse oximetry, reviewed.  Summary of this visit's results, reviewed by myself:  Labs:  No results found for this or any previous visit (from the past 24 hour(s)).  Imaging Studies: Dg Ribs Unilateral W/chest Right  05/14/2013   CLINICAL DATA:  Trauma, fall, right flank pain.  EXAM: RIGHT RIBS AND CHEST - 3+ VIEW  COMPARISON:  Prior radiograph from 09/13/2012.  FINDINGS: The cardiac and mediastinal silhouettes are stable in size and contour, and remain within normal limits.  Lungs are mildly hypoinflated. Diffuse prominence of the bronchovascular markings is similar as compared to prior study. No definite focal infiltrate identified. No pulmonary edema or pleural effusion. No pneumothorax.  Dedicated rib films demonstrate no evidence of acute fracture. Mild contour irregularity of the right posterior seventh rib is stable as compared to prior studies.  Deformity about the left shoulder is stable as compared to prior exam. No definite acute osseous abnormality. Multilevel degenerative changes noted within the visualized spine. Diffuse osteopenia noted.  IMPRESSION: 1. No acute displaced rib fracture identified. 2. Similar appearance of diffuse prominence of the bronchovascular markings without radiographic evidence of acute cardiopulmonary abnormality. 3. Stable posttraumatic deformity about the left shoulder. 4. Diffuse osteopenia.   Electronically Signed   By: Rise Mu M.D.   On: 05/14/2013 05:14   Ct Cervical Spine Wo Contrast  05/14/2013   CLINICAL DATA:  Trauma, fall  EXAM: CT CERVICAL SPINE WITHOUT CONTRAST  TECHNIQUE: Multidetector CT imaging of the cervical spine was performed without intravenous contrast. Multiplanar CT image reconstructions were also generated.  COMPARISON:  Prior CT from 04/20/2012  FINDINGS: Straightening of the normal cervical lordosis is similar to prior. There is trace anterior listhesis of C4 on C5, also stable.  Vertebral body heights are preserved. Normal C1-2 articulations are intact Schmorl multi atlanto occipital relationships are preserved. No prevertebral soft tissue swelling. No acute fracture or listhesis.  Severe multilevel degenerative disc disease as evidenced by intervertebral disc space narrowing, endplate sclerosis, and osteophytosis is seen, most severe at C4-5, C5-6, and C6-7. These findings are not significantly changed relative to prior.  Visualized soft tissues of the neck are within normal limits. Head vision 80 noted within the thyroid gland. Visualized lung apices are clear without evidence of apical pneumothorax.  IMPRESSION: 1. No acute traumatic injury within the cervical spine. 2. Severe multilevel degenerative disc disease, most prominent at C4 through C7, stable from prior.   Electronically Signed  By: Rise MuBenjamin  McClintock M.D.   On: 05/14/2013 05:23        Hanley SeamenJohn L Maki Sweetser, MD 05/14/13 [phone]

## 2013-05-14 NOTE — ED Notes (Signed)
MD at bedside. 

## 2013-05-14 NOTE — ED Notes (Signed)
Call placed for pt transport back to NH. 

## 2013-09-16 ENCOUNTER — Encounter (HOSPITAL_COMMUNITY): Payer: Self-pay | Admitting: Emergency Medicine

## 2013-09-16 ENCOUNTER — Inpatient Hospital Stay (HOSPITAL_COMMUNITY)
Admission: EM | Admit: 2013-09-16 | Discharge: 2013-09-23 | DRG: 154 | Disposition: A | Discharge status: Hospice | Payer: Medicare Other | Attending: Internal Medicine | Admitting: Internal Medicine

## 2013-09-16 ENCOUNTER — Emergency Department (HOSPITAL_COMMUNITY): Payer: Medicare Other

## 2013-09-16 DIAGNOSIS — N390 Urinary tract infection, site not specified: Secondary | ICD-10-CM | POA: Diagnosis present

## 2013-09-16 DIAGNOSIS — L0201 Cutaneous abscess of face: Secondary | ICD-10-CM | POA: Diagnosis present

## 2013-09-16 DIAGNOSIS — R740 Nonspecific elevation of levels of transaminase and lactic acid dehydrogenase [LDH]: Secondary | ICD-10-CM

## 2013-09-16 DIAGNOSIS — N762 Acute vulvitis: Secondary | ICD-10-CM | POA: Diagnosis present

## 2013-09-16 DIAGNOSIS — E079 Disorder of thyroid, unspecified: Secondary | ICD-10-CM | POA: Diagnosis present

## 2013-09-16 DIAGNOSIS — N179 Acute kidney failure, unspecified: Secondary | ICD-10-CM | POA: Diagnosis present

## 2013-09-16 DIAGNOSIS — G9340 Encephalopathy, unspecified: Secondary | ICD-10-CM | POA: Diagnosis present

## 2013-09-16 DIAGNOSIS — R404 Transient alteration of awareness: Secondary | ICD-10-CM | POA: Diagnosis present

## 2013-09-16 DIAGNOSIS — E43 Unspecified severe protein-calorie malnutrition: Secondary | ICD-10-CM | POA: Diagnosis present

## 2013-09-16 DIAGNOSIS — Z79899 Other long term (current) drug therapy: Secondary | ICD-10-CM

## 2013-09-16 DIAGNOSIS — E871 Hypo-osmolality and hyponatremia: Secondary | ICD-10-CM | POA: Diagnosis present

## 2013-09-16 DIAGNOSIS — B373 Candidiasis of vulva and vagina: Secondary | ICD-10-CM | POA: Diagnosis present

## 2013-09-16 DIAGNOSIS — R627 Adult failure to thrive: Secondary | ICD-10-CM | POA: Diagnosis present

## 2013-09-16 DIAGNOSIS — D72829 Elevated white blood cell count, unspecified: Secondary | ICD-10-CM | POA: Diagnosis present

## 2013-09-16 DIAGNOSIS — G309 Alzheimer's disease, unspecified: Secondary | ICD-10-CM | POA: Diagnosis present

## 2013-09-16 DIAGNOSIS — F411 Generalized anxiety disorder: Secondary | ICD-10-CM | POA: Diagnosis present

## 2013-09-16 DIAGNOSIS — Z66 Do not resuscitate: Secondary | ICD-10-CM | POA: Diagnosis not present

## 2013-09-16 DIAGNOSIS — Z515 Encounter for palliative care: Secondary | ICD-10-CM | POA: Diagnosis not present

## 2013-09-16 DIAGNOSIS — R7402 Elevation of levels of lactic acid dehydrogenase (LDH): Secondary | ICD-10-CM | POA: Diagnosis present

## 2013-09-16 DIAGNOSIS — R532 Functional quadriplegia: Secondary | ICD-10-CM | POA: Diagnosis present

## 2013-09-16 DIAGNOSIS — R633 Feeding difficulties, unspecified: Secondary | ICD-10-CM | POA: Diagnosis present

## 2013-09-16 DIAGNOSIS — F0280 Dementia in other diseases classified elsewhere without behavioral disturbance: Secondary | ICD-10-CM | POA: Diagnosis present

## 2013-09-16 DIAGNOSIS — D696 Thrombocytopenia, unspecified: Secondary | ICD-10-CM | POA: Diagnosis present

## 2013-09-16 DIAGNOSIS — E860 Dehydration: Secondary | ICD-10-CM | POA: Diagnosis present

## 2013-09-16 DIAGNOSIS — B3731 Acute candidiasis of vulva and vagina: Secondary | ICD-10-CM | POA: Diagnosis present

## 2013-09-16 DIAGNOSIS — Z7401 Bed confinement status: Secondary | ICD-10-CM | POA: Diagnosis not present

## 2013-09-16 DIAGNOSIS — K1120 Sialoadenitis, unspecified: Secondary | ICD-10-CM | POA: Diagnosis present

## 2013-09-16 DIAGNOSIS — IMO0002 Reserved for concepts with insufficient information to code with codable children: Secondary | ICD-10-CM

## 2013-09-16 DIAGNOSIS — E039 Hypothyroidism, unspecified: Secondary | ICD-10-CM | POA: Diagnosis present

## 2013-09-16 DIAGNOSIS — IMO0001 Reserved for inherently not codable concepts without codable children: Secondary | ICD-10-CM

## 2013-09-16 DIAGNOSIS — F0390 Unspecified dementia without behavioral disturbance: Secondary | ICD-10-CM | POA: Diagnosis present

## 2013-09-16 DIAGNOSIS — N760 Acute vaginitis: Secondary | ICD-10-CM | POA: Diagnosis present

## 2013-09-16 DIAGNOSIS — K113 Abscess of salivary gland: Secondary | ICD-10-CM | POA: Diagnosis not present

## 2013-09-16 DIAGNOSIS — R1310 Dysphagia, unspecified: Secondary | ICD-10-CM | POA: Diagnosis present

## 2013-09-16 DIAGNOSIS — L03211 Cellulitis of face: Secondary | ICD-10-CM | POA: Diagnosis not present

## 2013-09-16 DIAGNOSIS — R451 Restlessness and agitation: Secondary | ICD-10-CM

## 2013-09-16 DIAGNOSIS — R7401 Elevation of levels of liver transaminase levels: Secondary | ICD-10-CM | POA: Diagnosis present

## 2013-09-16 HISTORY — DX: Unspecified fracture of unspecified pubis, initial encounter for closed fracture: S32.509A

## 2013-09-16 HISTORY — DX: Unspecified dislocation of unspecified shoulder joint, initial encounter: S43.006A

## 2013-09-16 LAB — COMPREHENSIVE METABOLIC PANEL WITH GFR
ALT: 50 U/L — ABNORMAL HIGH (ref 0–35)
AST: 51 U/L — ABNORMAL HIGH (ref 0–37)
Albumin: 1.8 g/dL — ABNORMAL LOW (ref 3.5–5.2)
Alkaline Phosphatase: 444 U/L — ABNORMAL HIGH (ref 39–117)
Anion gap: 9 (ref 5–15)
BUN: 25 mg/dL — ABNORMAL HIGH (ref 6–23)
CO2: 24 meq/L (ref 19–32)
Calcium: 8.1 mg/dL — ABNORMAL LOW (ref 8.4–10.5)
Chloride: 107 meq/L (ref 96–112)
Creatinine, Ser: 0.72 mg/dL (ref 0.50–1.10)
GFR calc Af Amer: 88 mL/min — ABNORMAL LOW
GFR calc non Af Amer: 76 mL/min — ABNORMAL LOW
Glucose, Bld: 102 mg/dL — ABNORMAL HIGH (ref 70–99)
Potassium: 3.8 meq/L (ref 3.7–5.3)
Sodium: 140 meq/L (ref 137–147)
Total Bilirubin: 4.8 mg/dL — ABNORMAL HIGH (ref 0.3–1.2)
Total Protein: 6.3 g/dL (ref 6.0–8.3)

## 2013-09-16 LAB — CBC WITH DIFFERENTIAL/PLATELET
BASOS ABS: 0 10*3/uL (ref 0.0–0.1)
Basophils Relative: 0 % (ref 0–1)
EOS ABS: 0 10*3/uL (ref 0.0–0.7)
EOS PCT: 0 % (ref 0–5)
HCT: 38.4 % (ref 36.0–46.0)
Hemoglobin: 13.5 g/dL (ref 12.0–15.0)
Lymphocytes Relative: 12 % (ref 12–46)
Lymphs Abs: 1.5 10*3/uL (ref 0.7–4.0)
MCH: 33.3 pg (ref 26.0–34.0)
MCHC: 35.2 g/dL (ref 30.0–36.0)
MCV: 94.8 fL (ref 78.0–100.0)
MONO ABS: 0.5 10*3/uL (ref 0.1–1.0)
Monocytes Relative: 4 % (ref 3–12)
NEUTROS PCT: 84 % — AB (ref 43–77)
Neutro Abs: 10.5 10*3/uL — ABNORMAL HIGH (ref 1.7–7.7)
Platelets: 92 10*3/uL — ABNORMAL LOW (ref 150–400)
RBC: 4.05 MIL/uL (ref 3.87–5.11)
RDW: 16.8 % — ABNORMAL HIGH (ref 11.5–15.5)
WBC: 12.5 10*3/uL — ABNORMAL HIGH (ref 4.0–10.5)

## 2013-09-16 MED ORDER — SODIUM CHLORIDE 0.9 % IV SOLN
INTRAVENOUS | Status: DC
  Administered 2013-09-16: 19:00:00 via INTRAVENOUS

## 2013-09-16 MED ORDER — CLINDAMYCIN PHOSPHATE 600 MG/50ML IV SOLN
600.0000 mg | Freq: Once | INTRAVENOUS | Status: AC
  Administered 2013-09-16: 600 mg via INTRAVENOUS
  Filled 2013-09-16: qty 50

## 2013-09-16 MED ORDER — LORAZEPAM 2 MG/ML IJ SOLN
0.5000 mg | Freq: Once | INTRAMUSCULAR | Status: AC
  Administered 2013-09-16: 0.5 mg via INTRAVENOUS
  Filled 2013-09-16: qty 1

## 2013-09-16 MED ORDER — IOHEXOL 300 MG/ML  SOLN
100.0000 mL | Freq: Once | INTRAMUSCULAR | Status: AC | PRN
  Administered 2013-09-16: 100 mL via INTRAVENOUS

## 2013-09-16 MED ORDER — HALOPERIDOL LACTATE 5 MG/ML IJ SOLN
5.0000 mg | Freq: Once | INTRAMUSCULAR | Status: AC
  Administered 2013-09-16: 5 mg via INTRAVENOUS
  Filled 2013-09-16: qty 1

## 2013-09-16 NOTE — ED Provider Notes (Signed)
CSN: [id number]     Arrival date & time 09/16/13  1811 History   First MD Initiated Contact with Patient 09/16/13 1820     Chief Complaint  Patient presents with  . Lymphadenopathy     (Consider location/radiation/quality/duration/timing/severity/associated sxs/prior Treatment) HPI Comments: Patient here with 24 history of submandibular lymphadenopathy. According to her daughter, the facility noted swelling under both jaws x1 day. Patient's ulcer anorexia. She does have a history of advanced Alzheimer's. No fever, vomiting, diarrhea. Symptoms persisted. Nothing makes them better or worse and no treatment used prior to arrival.  The history is provided by a relative. The history is limited by the condition of the patient.    Past Medical History  Diagnosis Date  . Alzheimer's dementia   . UTI (urinary tract infection)   . Thyroid disease    History reviewed. No pertinent past surgical history. No family history on file. History  Substance Use Topics  . Smoking status: Unknown If Ever Smoked  . Smokeless tobacco: Not on file  . Alcohol Use: No   OB History   Grav Para Term Preterm Abortions TAB SAB Ect Mult Living                 Review of Systems  Unable to perform ROS     Allergies  Review of patient's allergies indicates no known allergies.  Home Medications   Prior to Admission medications   Medication Sig Start Date End Date Taking? Authorizing Provider  acetaminophen (TYLENOL) 500 MG tablet Take 1 tablet (500 mg total) by mouth every 6 (six) hours as needed for pain. 01/12/12  Yes Gwyneth Sprout, MD  fluticasone (FLONASE) 50 MCG/ACT nasal spray Place 2 sprays into the nose daily.   Yes Historical Provider, MD  HYDROcodone-acetaminophen (NORCO) 5-325 MG per tablet Take 1 tablet by mouth every 4 (four) hours as needed for pain. 04/20/12  Yes Geoffery Lyons, MD  levothyroxine (SYNTHROID, LEVOTHROID) 50 MCG tablet Take 50 mcg by mouth daily before breakfast.   Yes  Historical Provider, MD  LORazepam (ATIVAN) 0.5 MG tablet Take 0.5 mg by mouth every 8 (eight) hours as needed for anxiety (or agitation).   Yes Historical Provider, MD  memantine (NAMENDA) 10 MG tablet Take 10 mg by mouth 2 (two) times daily.   Yes Historical Provider, MD  Multiple Vitamins-Minerals (CERTAVITE SENIOR/ANTIOXIDANT) TABS Take by mouth.   Yes Historical Provider, MD  polyethylene glycol (MIRALAX / GLYCOLAX) packet Take 17 g by mouth daily.   Yes Historical Provider, MD  vitamin B-12 (CYANOCOBALAMIN) 1000 MCG tablet Take 1,000 mcg by mouth every other day.    Yes Historical Provider, MD  zolpidem (AMBIEN) 5 MG tablet Take 5 mg by mouth at bedtime as needed for sleep.   Yes Historical Provider, MD   BP 119/75  Pulse 82  Temp(Src) 98 F (36.7 C) (Oral)  Resp 18  SpO2 95% Physical Exam  Nursing note and vitals reviewed. Constitutional: She appears lethargic. She appears cachectic. She has a sickly appearance. She appears ill.  HENT:  Head: Normocephalic and atraumatic.  Submandibular lymphadenopathy noted bilaterally  Eyes: Conjunctivae, EOM and lids are normal. Pupils are equal, round, and reactive to light.  Neck: Normal range of motion. Neck supple. No tracheal deviation present. No mass present.  Cardiovascular: Normal rate, regular rhythm and normal heart sounds.  Exam reveals no gallop.   No murmur heard. Pulmonary/Chest: Effort normal and breath sounds normal. No stridor. No respiratory distress. She has no decreased  breath sounds. She has no wheezes. She has no rhonchi. She has no rales.  Abdominal: Soft. Normal appearance and bowel sounds are normal. She exhibits no distension. There is no tenderness. There is no rebound and no CVA tenderness.  Musculoskeletal: Normal range of motion. She exhibits no edema and no tenderness.  Lymphadenopathy:       Head (right side): Submandibular adenopathy present.       Head (left side): Submandibular adenopathy present.   Neurological: She appears lethargic. GCS eye subscore is 2. GCS verbal subscore is 3. GCS motor subscore is 4.  Skin: Skin is warm and dry. No abrasion and no rash noted.  Psychiatric: She has a normal mood and affect. Her speech is normal and behavior is normal.    ED Course  Procedures (including critical care time) Labs Review Labs Reviewed  CBC WITH DIFFERENTIAL  COMPREHENSIVE METABOLIC PANEL    Imaging Review No results found.   EKG Interpretation None      MDM   Final diagnoses:  None    Patient started on IV clindamycin for submandibular infection. Will be admitted to the hospitalist    Toy BakerAnthony T Jerrin Recore, MD 09/16/13 2139

## 2013-09-16 NOTE — ED Notes (Signed)
Bed: [id number] Expected date:  Expected time:  Means of arrival:  Comments: 87yoF/confused/alzheimer's

## 2013-09-16 NOTE — ED Notes (Signed)
Pt BIB EMS. Pt is from [facility]. Pt has swollen glands to both sides of neck. Areas are hard and rigid per EMS. Pt has old L humerus fracture. Pt has hx of Alzheimers. Pt arrives with no acute distress. Pt moaning. Pt does not respond to questions, just moans. Pt has no acute distress. Skin warm and dry.

## 2013-09-17 DIAGNOSIS — E43 Unspecified severe protein-calorie malnutrition: Secondary | ICD-10-CM | POA: Diagnosis present

## 2013-09-17 DIAGNOSIS — K1120 Sialoadenitis, unspecified: Secondary | ICD-10-CM

## 2013-09-17 LAB — BASIC METABOLIC PANEL
ANION GAP: 11 (ref 5–15)
BUN: 25 mg/dL — AB (ref 6–23)
CO2: 24 mEq/L (ref 19–32)
Calcium: 7.9 mg/dL — ABNORMAL LOW (ref 8.4–10.5)
Chloride: 106 mEq/L (ref 96–112)
Creatinine, Ser: 0.75 mg/dL (ref 0.50–1.10)
GFR calc Af Amer: 87 mL/min — ABNORMAL LOW (ref >90)
GFR, EST NON AFRICAN AMERICAN: 75 mL/min — AB (ref >90)
Glucose, Bld: 76 mg/dL (ref 70–99)
Potassium: 4.4 mEq/L (ref 3.7–5.3)
SODIUM: 141 meq/L (ref 137–147)

## 2013-09-17 LAB — URINE MICROSCOPIC-ADD ON

## 2013-09-17 LAB — CBC
HEMATOCRIT: 37.9 % (ref 36.0–46.0)
Hemoglobin: 13.3 g/dL (ref 12.0–15.0)
MCH: 34 pg (ref 26.0–34.0)
MCHC: 35.1 g/dL (ref 30.0–36.0)
MCV: 96.9 fL (ref 78.0–100.0)
Platelets: 73 10*3/uL — ABNORMAL LOW (ref 150–400)
RBC: 3.91 MIL/uL (ref 3.87–5.11)
RDW: 17.3 % — ABNORMAL HIGH (ref 11.5–15.5)
WBC: 9.7 10*3/uL (ref 4.0–10.5)

## 2013-09-17 LAB — URINALYSIS, ROUTINE W REFLEX MICROSCOPIC
Glucose, UA: NEGATIVE mg/dL
Ketones, ur: NEGATIVE mg/dL
Nitrite: NEGATIVE
PROTEIN: 100 mg/dL — AB
SPECIFIC GRAVITY, URINE: 1.038 — AB (ref 1.005–1.030)
UROBILINOGEN UA: 1 mg/dL (ref 0.0–1.0)
pH: 7.5 (ref 5.0–8.0)

## 2013-09-17 LAB — PROTIME-INR
INR: 1.48 (ref 0.00–1.49)
Prothrombin Time: 17.9 seconds — ABNORMAL HIGH (ref 11.6–15.2)

## 2013-09-17 LAB — MRSA PCR SCREENING: MRSA by PCR: POSITIVE — AB

## 2013-09-17 MED ORDER — ENSURE COMPLETE PO LIQD
237.0000 mL | Freq: Three times a day (TID) | ORAL | Status: DC
  Administered 2013-09-17: 237 mL via ORAL

## 2013-09-17 MED ORDER — MORPHINE SULFATE 2 MG/ML IJ SOLN
1.0000 mg | INTRAMUSCULAR | Status: DC | PRN
  Administered 2013-09-17 – 2013-09-19 (×8): 1 mg via INTRAVENOUS
  Filled 2013-09-17 (×8): qty 1

## 2013-09-17 MED ORDER — LORAZEPAM 2 MG/ML IJ SOLN
0.5000 mg | Freq: Three times a day (TID) | INTRAMUSCULAR | Status: DC | PRN
  Administered 2013-09-17 – 2013-09-19 (×3): 1 mg via INTRAVENOUS
  Filled 2013-09-17 (×3): qty 1

## 2013-09-17 MED ORDER — HYDROCODONE-ACETAMINOPHEN 5-325 MG PO TABS: 1.0000 | ORAL_TABLET | ORAL | Status: DC | PRN

## 2013-09-17 MED ORDER — VITAMIN B-12 1000 MCG PO TABS
1000.0000 ug | ORAL_TABLET | ORAL | Status: DC
  Filled 2013-09-17 (×3): qty 1

## 2013-09-17 MED ORDER — DEXTROSE 5 % IV SOLN
1.0000 g | INTRAVENOUS | Status: DC
  Administered 2013-09-17 – 2013-09-18 (×2): 1 g via INTRAVENOUS
  Filled 2013-09-17 (×3): qty 10

## 2013-09-17 MED ORDER — POLYETHYLENE GLYCOL 3350 17 G PO PACK
17.0000 g | PACK | Freq: Every day | ORAL | Status: DC
  Filled 2013-09-17 (×3): qty 1

## 2013-09-17 MED ORDER — LORAZEPAM 0.5 MG PO TABS: 0.5000 mg | ORAL_TABLET | Freq: Three times a day (TID) | ORAL | Status: DC | PRN

## 2013-09-17 MED ORDER — HALOPERIDOL LACTATE 5 MG/ML IJ SOLN: 5.0000 mg | Freq: Four times a day (QID) | INTRAMUSCULAR | Status: DC | PRN

## 2013-09-17 MED ORDER — ENOXAPARIN SODIUM 40 MG/0.4ML ~~LOC~~ SOLN
40.0000 mg | SUBCUTANEOUS | Status: DC
  Administered 2013-09-17 – 2013-09-21 (×5): 40 mg via SUBCUTANEOUS
  Filled 2013-09-17 (×7): qty 0.4

## 2013-09-17 MED ORDER — ADULT MULTIVITAMIN W/MINERALS CH
1.0000 | ORAL_TABLET | Freq: Every day | ORAL | Status: DC
  Filled 2013-09-17 (×7): qty 1

## 2013-09-17 MED ORDER — CLINDAMYCIN PHOSPHATE 900 MG/50ML IV SOLN
900.0000 mg | Freq: Three times a day (TID) | INTRAVENOUS | Status: DC
  Administered 2013-09-17 – 2013-09-19 (×8): 900 mg via INTRAVENOUS
  Filled 2013-09-17 (×9): qty 50

## 2013-09-17 MED ORDER — LEVOTHYROXINE SODIUM 50 MCG PO TABS
50.0000 ug | ORAL_TABLET | Freq: Every day | ORAL | Status: DC
  Administered 2013-09-20: 50 ug via ORAL
  Filled 2013-09-17 (×7): qty 1

## 2013-09-17 MED ORDER — SODIUM CHLORIDE 0.9 % IJ SOLN: 3.0000 mL | Freq: Two times a day (BID) | INTRAMUSCULAR | Status: DC

## 2013-09-17 MED ORDER — FLUTICASONE PROPIONATE 50 MCG/ACT NA SUSP
2.0000 | Freq: Every day | NASAL | Status: DC
  Filled 2013-09-17: qty 16

## 2013-09-17 MED ORDER — SODIUM CHLORIDE 0.9 % IV SOLN
INTRAVENOUS | Status: DC
Start: 2013-09-17 — End: 2013-09-18

## 2013-09-17 MED ORDER — MEMANTINE HCL 10 MG PO TABS
10.0000 mg | ORAL_TABLET | Freq: Two times a day (BID) | ORAL | Status: DC
  Filled 2013-09-17 (×12): qty 1

## 2013-09-17 MED ORDER — ZOLPIDEM TARTRATE 5 MG PO TABS: 5.0000 mg | ORAL_TABLET | Freq: Every evening | ORAL | Status: DC | PRN

## 2013-09-17 MED ORDER — NYSTATIN 100000 UNIT/GM EX POWD
Freq: Two times a day (BID) | CUTANEOUS | Status: DC
  Administered 2013-09-17 – 2013-09-23 (×13): via TOPICAL
  Filled 2013-09-17: qty 15

## 2013-09-17 MED ORDER — ACETAMINOPHEN 500 MG PO TABS: 500.0000 mg | ORAL_TABLET | Freq: Four times a day (QID) | ORAL | Status: DC | PRN

## 2013-09-17 NOTE — Progress Notes (Signed)
Patient ID: Sheri Stone, female   DOB: [birth year], 80 y.o.   MRN: [id number]  [facility] PROGRESS NOTE  Sheri Stone NWG:[id number] DOB: [birth year] DOA: 09/16/2013 PCP: Pcp Not In System  Brief narrative: 80 yr old woman who was sent from the nursing facility for an increasing submandibular mass. Pt has dementia at baseline and was unable to provide nay history. Non verbal on admission.   Active Problems:   Abscess of submandibular gland - continue current ABX and monitor clinical response    UTI (urinary tract infection) - obtain urine culture and urine analysis - place on empiric Rocephin    Vulvar cellulitis - current abx should be adequate - place order for barrier cream and place foley    Dementia - will likely go to SNF upon discharge - still full code, will clarify with family   Protein-calorie malnutrition, severe - SLP evaluation and advancing diet as pt able to tolerate    Leukocytosis - secondary to abscess as noted above - ABX as noted above, WBC trending down - repeat CBC in AM   Thrombocytopenia - monitor closely - Lovenox for DVT prophylaxis  - CBC In AM   Transaminitis - CMET in AM  Consultants:  None  Procedures/Studies: Ct Soft Tissue Neck W Contrast  09/16/2013  Marked enlargement of the submandibular gland bilaterally with increased enhancement and surrounding edema suggesting acute infection. Multiple small fluid collections are present in both submandibular glands suggesting multiple micro abscesses. These all measure under 1 cm.    Antibiotics:  Clindamycin 7/21 -->  Rocephin 7/22 -->   Code Status: Full Family Communication: Daughter over the phone  Disposition Plan: SNF when ready   HPI/Subjective: No events overnight.   Objective: Filed Vitals:   09/17/13 0027 09/17/13 0504 09/17/13 1400 09/17/13 1531  BP: 126/92 124/50 129/88   Pulse: 97 92 98   Temp: 97.6 F (36.4 C) 97.3 F (36.3 C)  97.2 F (36.2 C)  TempSrc: Oral Oral  Oral Axillary  Resp: 20 16 25    Height:    5\' 2"  (1.575 m)  Weight: 55.7 kg (122 lb 12.7 oz)     SpO2: 97% 97% 97%     Intake/Output Summary (Last 24 hours) at 09/17/13 1608 Last data filed at 09/17/13 1507  Gross per 24 hour  Intake 318.67 ml  Output      0 ml  Net 318.67 ml    Exam:   General:  Pt is lethargic, non verbal   Cardiovascular: Regular rate and rhythm,  no rubs, no gallops  Respiratory: Clear to auscultation bilaterally, no wheezing, diminished breath sounds at bases   Abdomen: Soft, non tender, non distended, bowel sounds present, no guarding  Extremities: No edema, pulses DP and PT palpable bilaterally  Data Reviewed: Basic Metabolic Panel:  Recent Labs Lab 09/16/13 1850 09/17/13 0750  NA 140 141  K 3.8 4.4  CL 107 106  CO2 24 24  GLUCOSE 102* 76  BUN 25* 25*  CREATININE 0.72 0.75  CALCIUM 8.1* 7.9*   Liver Function Tests:  Recent Labs Lab 09/16/13 1850  AST 51*  ALT 50*  ALKPHOS 444*  BILITOT 4.8*  PROT 6.3  ALBUMIN 1.8*   CBC:  Recent Labs Lab 09/16/13 1850 09/17/13 0750  WBC 12.5* 9.7  NEUTROABS 10.5*  --   HGB 13.5 13.3  HCT 38.4 37.9  MCV 94.8 96.9  PLT 92* 73*    Scheduled Meds: . cefTRIAXone (ROCEPHIN)  IV  1 g Intravenous Q24H  . clindamycin (CLEOCIN) IV  900 mg Intravenous 3 times per day  . enoxaparin (LOVENOX) injection  40 mg Subcutaneous Q24H  . feeding supplement (ENSURE COMPLETE)  237 mL Oral TID WC  . fluticasone  2 spray Each Nare Daily  . levothyroxine  50 mcg Oral QAC breakfast  . memantine  10 mg Oral BID  . multivitamin with minerals  1 tablet Oral Daily  . nystatin   Topical BID  . polyethylene glycol  17 g Oral Daily  . sodium chloride  3 mL Intravenous Q12H  . [START ON 09/18/2013] vitamin B-12  1,000 mcg Oral QODAY   Continuous Infusions: . sodium chloride 20 mL/hr at 09/16/13 1904  . sodium chloride 75 mL/hr at 09/17/13 0715     Debbora PrestoMAGICK-Michaiah Holsopple, MD  [facility] Pager [phone]  If  7PM-7AM, please contact night-coverage [email or web address] Password [email or web address] 09/17/2013, 4:08 PM   LOS: 1 day

## 2013-09-17 NOTE — H&P (Signed)
Sheri Stone is an 80 y.o. female.   Chief Complaint: Submandibular mass. HPI: Pt is a demented 80 yr old woman who was sent from the nursing facility for an increasing submandibular mass.  As I visit the patient she is extremely agitated and crying out. There is not family present. She does not speak intelligibly or answer questions.  Pt is rolling about in bed, crying out and clutching her mons pubis.  Past Medical History  Diagnosis Date  . Alzheimer's dementia   . UTI (urinary tract infection)   . Thyroid disease   . Shoulder dislocation approx. 2013    left  . Pubic bone fracture approx April 2015    Past Surgical History  Procedure Laterality Date  . Cholecystectomy      History reviewed. No pertinent family history.Unobtainable Social History:  reports that she has never smoked. She has never used smokeless tobacco. She reports that she does not drink alcohol or use illicit drugs.  Allergies: No Known Allergies  Medications Prior to Admission  Medication Sig Dispense Refill  . acetaminophen (TYLENOL) 500 MG tablet Take 1 tablet (500 mg total) by mouth every 6 (six) hours as needed for pain.  30 tablet  0  . fluticasone (FLONASE) 50 MCG/ACT nasal spray Place 2 sprays into the nose daily.      Marland Kitchen HYDROcodone-acetaminophen (NORCO) 5-325 MG per tablet Take 1 tablet by mouth every 4 (four) hours as needed for pain.  20 tablet  0  . levothyroxine (SYNTHROID, LEVOTHROID) 50 MCG tablet Take 50 mcg by mouth daily before breakfast.      . LORazepam (ATIVAN) 0.5 MG tablet Take 0.5 mg by mouth every 8 (eight) hours as needed for anxiety (or agitation).      . memantine (NAMENDA) 10 MG tablet Take 10 mg by mouth 2 (two) times daily.      . Multiple Vitamins-Minerals (CERTAVITE SENIOR/ANTIOXIDANT) TABS Take by mouth.      . polyethylene glycol (MIRALAX / GLYCOLAX) packet Take 17 g by mouth daily.      . vitamin B-12 (CYANOCOBALAMIN) 1000 MCG tablet Take 1,000 mcg by mouth every other day.        . zolpidem (AMBIEN) 5 MG tablet Take 5 mg by mouth at bedtime as needed for sleep.        Results for orders placed during the hospital encounter of 09/16/13 (from the past 48 hour(s))  CBC WITH DIFFERENTIAL     Status: Abnormal   Collection Time    09/16/13  6:50 PM      Result Value Ref Range   WBC 12.5 (*) 4.0 - 10.5 K/uL   RBC 4.05  3.87 - 5.11 MIL/uL   Hemoglobin 13.5  12.0 - 15.0 g/dL   HCT 38.4  36.0 - 46.0 %   MCV 94.8  78.0 - 100.0 fL   MCH 33.3  26.0 - 34.0 pg   MCHC 35.2  30.0 - 36.0 g/dL   RDW 16.8 (*) 11.5 - 15.5 %   Platelets 92 (*) 150 - 400 K/uL   Comment: REPEATED TO VERIFY     SPECIMEN CHECKED FOR CLOTS     PLATELET COUNT CONFIRMED BY SMEAR     LARGE PLATELETS PRESENT   Neutrophils Relative % 84 (*) 43 - 77 %   Lymphocytes Relative 12  12 - 46 %   Monocytes Relative 4  3 - 12 %   Eosinophils Relative 0  0 - 5 %  Basophils Relative 0  0 - 1 %   Neutro Abs 10.5 (*) 1.7 - 7.7 K/uL   Lymphs Abs 1.5  0.7 - 4.0 K/uL   Monocytes Absolute 0.5  0.1 - 1.0 K/uL   Eosinophils Absolute 0.0  0.0 - 0.7 K/uL   Basophils Absolute 0.0  0.0 - 0.1 K/uL   Smear Review MORPHOLOGY UNREMARKABLE    COMPREHENSIVE METABOLIC PANEL     Status: Abnormal   Collection Time    09/16/13  6:50 PM      Result Value Ref Range   Sodium 140  137 - 147 mEq/L   Potassium 3.8  3.7 - 5.3 mEq/L   Chloride 107  96 - 112 mEq/L   CO2 24  19 - 32 mEq/L   Glucose, Bld 102 (*) 70 - 99 mg/dL   BUN 25 (*) 6 - 23 mg/dL   Creatinine, Ser 0.72  0.50 - 1.10 mg/dL   Calcium 8.1 (*) 8.4 - 10.5 mg/dL   Total Protein 6.3  6.0 - 8.3 g/dL   Albumin 1.8 (*) 3.5 - 5.2 g/dL   AST 51 (*) 0 - 37 U/L   ALT 50 (*) 0 - 35 U/L   Alkaline Phosphatase 444 (*) 39 - 117 U/L   Total Bilirubin 4.8 (*) 0.3 - 1.2 mg/dL   GFR calc non Af Amer 76 (*) >90 mL/min   GFR calc Af Amer 88 (*) >90 mL/min   Comment: (NOTE)     The eGFR has been calculated using the CKD EPI equation.     This calculation has not been validated  in all clinical situations.     eGFR's persistently <90 mL/min signify possible Chronic Kidney     Disease.   Anion gap 9  5 - 15   Ct Soft Tissue Neck W Contrast  09/16/2013   CLINICAL DATA:  Lymphadenopathy. Elevated white blood count. Afebrile  EXAM: CT NECK WITH CONTRAST  TECHNIQUE: Multidetector CT imaging of the neck was performed using the standard protocol following the bolus administration of intravenous contrast.  CONTRAST:  17m OMNIPAQUE IOHEXOL 300 MG/ML  SOLN  COMPARISON:  None.  FINDINGS: Image quality degraded by motion. The patient was not able to hold still for the scan.  Marked enlargement of the submandibular gland bilaterally which shows increased enhancement and surrounding subcutaneous and soft tissue edema. Multiple small ring-enhancing low-density fluid collections are present in the submandibular gland bilaterally, right greater than left suggesting micro abscesses.  The parotid gland is normal bilaterally. The tongue is normal. The pharynx is normal. There is no airway compromise. Larynx is normal.  The thyroid is normal in size.  Lung apices are clear.  Negative for pathologic adenopathy in the neck.  Moderate cervical spondylosis.  IMPRESSION: Marked enlargement of the submandibular gland bilaterally with increased enhancement and surrounding edema suggesting acute infection. Multiple small fluid collections are present in both submandibular glands suggesting multiple micro abscesses. These all measure under 1 cm.   Electronically Signed   By: CFranchot GalloM.D.   On: 09/16/2013 20:39    Review of Systems  Unable to perform ROS: dementia    Blood pressure 126/92, pulse 97, temperature 97.6 F (36.4 C), temperature source Oral, resp. rate 20, weight 55.7 kg (122 lb 12.7 oz), SpO2 97.00%. Physical Exam  Constitutional: She appears cachectic. She is active. She appears ill. She appears distressed.  Pt is very agitated and crying out. Unable to assist with history and/or  physical.  Eyes: Conjunctivae and lids are normal. Pupils are equal, round, and reactive to light. Right eye exhibits no chemosis, no discharge and no exudate. Left eye exhibits no chemosis, no discharge and no exudate. Right conjunctiva is not injected. Left conjunctiva is not injected. No scleral icterus.  Pt is unable to cooperate with EOM exam.  Neck: Trachea normal. Normal carotid pulses, no hepatojugular reflux and no JVD present. No tracheal tenderness present. Carotid bruit is not present. No tracheal deviation present. No mass and no thyromegaly present.    Cardiovascular: Normal rate, regular rhythm, S1 normal and S2 normal.   No extrasystoles are present. PMI is not displaced.  Exam reveals decreased pulses. Exam reveals no gallop.   No murmur heard. Respiratory: Effort normal and breath sounds normal. No accessory muscle usage or stridor. No apnea and not tachypneic. No respiratory distress.  Breath sounds are difficult to auscultate as pt is crying out loudly through exam.  GI: Normal appearance. She exhibits no shifting dullness, no distension, no pulsatile liver, no fluid wave, no abdominal bruit, no ascites, no pulsatile midline mass and no mass. There is no hepatosplenomegaly. There is no tenderness. There is no rigidity, no rebound, no guarding, no CVA tenderness, no tenderness at McBurney's point and negative Murphy's sign. No hernia.  Genitourinary: There is rash and tenderness on the right labia. There is rash and tenderness on the left labia.  Pt has severe erythema of labia majora bilaterally. She has whitish exudate within the labia majora and minora.  Lymphadenopathy:       Head (right side): Submandibular adenopathy present. No preauricular adenopathy present.       Head (left side): No submandibular and no preauricular adenopathy present.    She has cervical adenopathy.  Neurological:  Pt is moving all extremities.  She is not able to comply with a neurological exam.    Skin: Skin is warm, dry and intact. No abrasion, no bruising and no ecchymosis noted. There is erythema.  Erythema to labia bilaterally.  Psychiatric:  Pt is extremely distressed and agitated and unable to cooperate with exam.     Assessment/Plan 1. Submandibular abscess - IV Clindamycin 2. UTI - Continue amoxicillin 3. Vulvar cellulitis - IV clindamycin and amoxicillin 4. Vulvovaginal candidiasis Nystatin and Diflucan 5. Advanced dementia - Continue regular medications 6. Hypothyroidism - Continue regular medications 7. Agitation - IV Haldol as needed as well as treat pt discomfort.  Sheri Stone 09/17/2013, 1:06 AM

## 2013-09-17 NOTE — Progress Notes (Signed)
INITIAL NUTRITION ASSESSMENT  DOCUMENTATION CODES Per approved criteria  -Severe malnutrition in the context of chronic illness  Pt meets criteria for severe MALNUTRITION in the context of chronic illness as evidenced by severe muscle depletion and subcutaneous fat loss, PO intake <75% for > one month.    INTERVENTION: -Recommend Ensure Complete TID -Recommend continued liberalized diet -Will continue to monitor  NUTRITION DIAGNOSIS: Inadequate oral intake related to dementia/agitation as evidenced by PO intake <75% for > one month, 20% body weight loss.   Goal: Pt to meet >/= 90% of their estimated nutrition needs    Monitor:  Total protein/energy intake, labs, weights, skin integrity  Reason for Assessment: Low Braden/MST  80 y.o. female  Admitting Dx: <principal problem not specified>  ASSESSMENT: Pt is a demented 80 yr old woman who was sent from the nursing facility for an increasing submandibular mass  -Pt's daughter reported ongoing decreased appetite and weight loss since onset of Alzheimer's disease.  -PO intake minimal, <25% of meals. Daughter noted that pt "picks" at foods, but can tolerate regular diet textures. Was supposed to be on Ensure supplements at Rehab facility, but daughter had not seen pt ever receive any.  -Pt agitated, and in pain, 0% PO intake. Will order Ensure TID -Endorsed gradual weight of approximately 30 lbs. Loses 3-5 lbs per week -Has evident loss of muscle wasting or subcutaneous fat loss  Height: Ht Readings from Last 1 Encounters:  No data found for Ht  Daughter reported: 5\' 2"   Weight: Wt Readings from Last 1 Encounters:  09/17/13 122 lb 12.7 oz (55.7 kg)    Ideal Body Weight: 110 lbs  % Ideal Body Weight: 111%  Wt Readings from Last 10 Encounters:  09/17/13 122 lb 12.7 oz (55.7 kg)    Usual Body Weight: 150 lbs  % Usual Body Weight: 81%  BMI:  There is no height on file to calculate BMI.  Estimated Nutritional  Needs: Kcal: 1650-1850 Protein: 70-80 gram Fluid: >/=1700 ml/daily  Skin: Stage 2 PU on buttocks  Diet Order: General  EDUCATION NEEDS: -Education needs addressed   Intake/Output Summary (Last 24 hours) at 09/17/13 1045 Last data filed at 09/17/13 0600  Gross per 24 hour  Intake 218.67 ml  Output      0 ml  Net 218.67 ml    Last BM: 7/22   Labs:   Recent Labs Lab 09/16/13 1850 09/17/13 0750  NA 140 141  K 3.8 4.4  CL 107 106  CO2 24 24  BUN 25* 25*  CREATININE 0.72 0.75  CALCIUM 8.1* 7.9*  GLUCOSE 102* 76    CBG (last 3)  No results found for this basename: GLUCAP,  in the last 72 hours  Scheduled Meds: . clindamycin (CLEOCIN) IV  900 mg Intravenous 3 times per day  . enoxaparin (LOVENOX) injection  40 mg Subcutaneous Q24H  . feeding supplement (ENSURE COMPLETE)  237 mL Oral TID WC  . fluticasone  2 spray Each Nare Daily  . levothyroxine  50 mcg Oral QAC breakfast  . memantine  10 mg Oral BID  . multivitamin with minerals  1 tablet Oral Daily  . nystatin   Topical BID  . polyethylene glycol  17 g Oral Daily  . sodium chloride  3 mL Intravenous Q12H  . [START ON 09/18/2013] vitamin B-12  1,000 mcg Oral QODAY    Continuous Infusions: . sodium chloride 20 mL/hr at 09/16/13 1904  . sodium chloride 75 mL/hr at 09/17/13 0715  Past Medical History  Diagnosis Date  . Alzheimer's dementia   . UTI (urinary tract infection)   . Thyroid disease   . Shoulder dislocation approx. 2013    left  . Pubic bone fracture approx April 2015    Past Surgical History  Procedure Laterality Date  . Cholecystectomy      Lloyd HugerSarah F Verina Galeno MS RD LDN Clinical Dietitian [phone]

## 2013-09-17 NOTE — Progress Notes (Signed)
Patient was stable at time of transfer. [facility]

## 2013-09-18 LAB — COMPREHENSIVE METABOLIC PANEL
ALBUMIN: 1.7 g/dL — AB (ref 3.5–5.2)
ALT: 44 U/L — ABNORMAL HIGH (ref 0–35)
ANION GAP: 13 (ref 5–15)
AST: 58 U/L — ABNORMAL HIGH (ref 0–37)
Alkaline Phosphatase: 437 U/L — ABNORMAL HIGH (ref 39–117)
BUN: 29 mg/dL — ABNORMAL HIGH (ref 6–23)
CO2: 20 mEq/L (ref 19–32)
CREATININE: 0.97 mg/dL (ref 0.50–1.10)
Calcium: 7.5 mg/dL — ABNORMAL LOW (ref 8.4–10.5)
Chloride: 109 mEq/L (ref 96–112)
GFR calc Af Amer: 60 mL/min — ABNORMAL LOW (ref >90)
GFR calc non Af Amer: 52 mL/min — ABNORMAL LOW (ref >90)
Glucose, Bld: 71 mg/dL (ref 70–99)
Potassium: 4 mEq/L (ref 3.7–5.3)
Sodium: 142 mEq/L (ref 137–147)
TOTAL PROTEIN: 6.3 g/dL (ref 6.0–8.3)
Total Bilirubin: 4.3 mg/dL — ABNORMAL HIGH (ref 0.3–1.2)

## 2013-09-18 LAB — CBC
HEMATOCRIT: 37.3 % (ref 36.0–46.0)
Hemoglobin: 13.4 g/dL (ref 12.0–15.0)
MCH: 33.8 pg (ref 26.0–34.0)
MCHC: 35.9 g/dL (ref 30.0–36.0)
MCV: 94.2 fL (ref 78.0–100.0)
Platelets: 93 10*3/uL — ABNORMAL LOW (ref 150–400)
RBC: 3.96 MIL/uL (ref 3.87–5.11)
RDW: 17.2 % — AB (ref 11.5–15.5)
WBC: 9.4 10*3/uL (ref 4.0–10.5)

## 2013-09-18 LAB — URINE CULTURE
Colony Count: NO GROWTH
Culture: NO GROWTH

## 2013-09-18 MED ORDER — MAGNESIUM SULFATE 40 MG/ML IJ SOLN
2.0000 g | Freq: Once | INTRAMUSCULAR | Status: AC
  Administered 2013-09-18: 2 g via INTRAVENOUS
  Filled 2013-09-18: qty 50

## 2013-09-18 MED ORDER — SODIUM CHLORIDE 0.9 % IV SOLN
INTRAVENOUS | Status: DC
  Administered 2013-09-18: 12:00:00 via INTRAVENOUS

## 2013-09-18 MED ORDER — DEXTROSE-NACL 5-0.45 % IV SOLN
INTRAVENOUS | Status: DC
  Administered 2013-09-18: 03:00:00 via INTRAVENOUS

## 2013-09-18 NOTE — Progress Notes (Signed)
Occupational Therapy Evaluation Patient Details Name: Sheri Stone MRN: [id number] DOB: 09-28-1926 Today's Date: 09/18/2013    History of Present Illness 80 yr old woman who was sent from the nursing facility for an increasing submandibular mass. Pt has dementia at baseline    Clinical Impression   Patient likely at baseline with ADLs; will defer further OT tx to SNF.    Follow Up Recommendations  SNF;Supervision/Assistance - 24 hour    Equipment Recommendations  Other (comment) (tbd at SNF)    Recommendations for Other Services       Precautions / Restrictions Precautions Precautions: Fall      Mobility Bed Mobility Overal bed mobility: Needs Assistance Bed Mobility: Rolling Rolling: Total assist            Transfers                      Balance                                            ADL Overall ADL's : Needs assistance/impaired Eating/Feeding: Total assistance   Grooming: Total assistance   Upper Body Bathing: Total assistance   Lower Body Bathing: Total assistance   Upper Body Dressing : Total assistance   Lower Body Dressing: Total assistance                 General ADL Comments: Patient dependent with all ADLs at this time. ? if this is her baseline. She is a nursing home resident with dementia. History of L shoulder fx in 2013 and pubic bone fx April 2015.     Vision                     Perception     Praxis      Pertinent Vitals/Pain Moans in pain with any movement     Hand Dominance  (unknown)   Extremity/Trunk Assessment Upper Extremity Assessment Upper Extremity Assessment: LUE deficits/detail;Difficult to assess due to impaired cognition LUE Deficits / Details: appears to be contracted in shoulder abduction and elbow flexion. Screams with attempts to move it. LUE: Unable to fully assess due to pain   Lower Extremity Assessment Lower Extremity Assessment: Defer to PT evaluation        Communication Communication Communication: Other (comment) (non-verbal, moaning)   Cognition Arousal/Alertness: Lethargic Behavior During Therapy: Restless Overall Cognitive Status: History of cognitive impairments - at baseline                     General Comments       Exercises       Shoulder Instructions      Home Living Family/patient expects to be discharged to:: Skilled nursing facility                                        Prior Functioning/Environment          Comments: unknown and pt unable to state    OT Diagnosis: Cognitive deficits;Generalized weakness;Acute pain   OT Problem List: Decreased strength;Decreased range of motion;Decreased activity tolerance;Decreased cognition;Decreased safety awareness;Pain;Impaired UE functional use   OT Treatment/Interventions:      OT Goals(Current goals can be found in the care plan section)  OT Frequency:     Barriers to D/C:            Co-evaluation              End of Session    Activity Tolerance: Patient limited by pain;Patient limited by lethargy Patient left: in bed;with call bell/phone within reach;with bed alarm set   Time: 1053-1107 OT Time Calculation (min): 14 min Charges:  OT General Charges $OT Visit: 1 Procedure OT Evaluation $Initial OT Evaluation Tier I: 1 Procedure OT Treatments $Therapeutic Activity: 8-22 mins G-Codes:    Sheri Stone A October 07, 2013, 11:59 AM

## 2013-09-18 NOTE — Progress Notes (Addendum)
Patient ID: Sheri Stone, female   DOB: [birth year], 80 y.o.   MRN: [id number]  [facility] PROGRESS NOTE  NERIDA BOIVIN NWG:[id number] DOB: 1926-05-10 DOA: 09/16/2013 PCP: Pcp Not In System  Brief narrative:  80 yr old woman who was sent from the nursing facility for an increasing submandibular mass. Pt has dementia at baseline and was unable to provide nay history. Non verbal on admission.   Active Problems:  Abscess of submandibular gland  - continue current ABX Clindamycin day #3 UTI (urinary tract infection)  - urine culture pending  - continue Rocephin day #2 Vulvar cellulitis  - current abx should be adequate  - place order for barrier cream  - keep foley for now  Dementia, advanced  - will likely go to SNF upon discharge  - still full code, will clarify with family  Protein-calorie malnutrition, severe  - SLP evaluation and advancing diet as pt able to tolerate  Leukocytosis  - secondary to abscess as noted above  - ABX as noted above, WBC trending down and WNL this AM - repeat CBC in AM  Thrombocytopenia  - monitor closely  - Lovenox for DVT prophylaxis  - CBC In AM  Transaminitis  - CMET in AM  DVT prophylaxis - Lovenox SQ  Consultants:  None  Procedures/Studies:  Ct Soft Tissue Neck W Contrast 09/16/2013 Marked enlargement of the submandibular gland bilaterally with increased enhancement and surrounding edema suggesting acute infection. Multiple small fluid collections are present in both submandibular glands suggesting multiple micro abscesses. These all measure under 1 cm.  Antibiotics:  Clindamycin 7/21 -->  Rocephin 7/22 -->   Code Status: Full  Family Communication: Daughter over the phone  Disposition Plan: SNF when ready   HPI/Subjective: No events overnight.   Objective: Filed Vitals:   09/17/13 1400 09/17/13 1531 09/17/13 2214 09/18/13 0608  BP: 129/88  113/62 131/88  Pulse: 98  111 106  Temp:  97.2 F (36.2 C) 97.8 F (36.6 C) 97.9 F  (36.6 C)  TempSrc: Oral Axillary Axillary Axillary  Resp: 25  22 24   Height:  5\' 2"  (1.575 m)    Weight:      SpO2: 97%  96% 95%    Intake/Output Summary (Last 24 hours) at 09/18/13 1115 Last data filed at 09/18/13 1005  Gross per 24 hour  Intake  747.5 ml  Output   1100 ml  Net -352.5 ml    Exam:   General:  Pt is alert, not following nay commands, confused, not answering any questions appropriately   Cardiovascular: Regular rhythm, tachycardic, no rubs, no gallops  Respiratory: Clear to auscultation bilaterally, no wheezing, diminished breath sounds at bases   Abdomen: Soft, non tender, non distended, bowel sounds present, no guarding  Extremities: No edema, pulses DP and PT palpable bilaterally  Data Reviewed: Basic Metabolic Panel:  Recent Labs Lab 09/16/13 1850 09/17/13 0750 09/18/13 0040  NA 140 141 142  K 3.8 4.4 4.0  CL 107 106 109  CO2 24 24 20   GLUCOSE 102* 76 71  BUN 25* 25* 29*  CREATININE 0.72 0.75 0.97  CALCIUM 8.1* 7.9* 7.5*   Liver Function Tests:  Recent Labs Lab 09/16/13 1850 09/18/13 0040  AST 51* 58*  ALT 50* 44*  ALKPHOS 444* 437*  BILITOT 4.8* 4.3*  PROT 6.3 6.3  ALBUMIN 1.8* 1.7*   CBC:  Recent Labs Lab 09/16/13 1850 09/17/13 0750 09/18/13 0040  WBC 12.5* 9.7 9.4  NEUTROABS 10.5*  --   --  HGB 13.5 13.3 13.4  HCT 38.4 37.9 37.3  MCV 94.8 96.9 94.2  PLT 92* 73* 93*   Recent Results (from the past 240 hour(s))  MRSA PCR SCREENING     Status: Abnormal   Collection Time    09/17/13 10:12 PM      Result Value Ref Range Status   MRSA by PCR POSITIVE (*) NEGATIVE Final   Comment:            The GeneXpert MRSA Assay (FDA     approved for NASAL specimens     only), is one component of a     comprehensive MRSA colonization     surveillance program. It is not     intended to diagnose MRSA     infection nor to guide or     monitor treatment for     MRSA infections.     RESULT CALLED TO, READ BACK BY AND VERIFIED  WITH:     BRALLEY,B RN AT 2334 07.22.15 BY TIBBITTS,K     Scheduled Meds: . cefTRIAXone (ROCEPHIN)  IV  1 g Intravenous Q24H  . clindamycin (CLEOCIN) IV  900 mg Intravenous 3 times per day  . enoxaparin (LOVENOX) injection  40 mg Subcutaneous Q24H  . feeding supplement (ENSURE COMPLETE)  237 mL Oral TID WC  . fluticasone  2 spray Each Nare Daily  . levothyroxine  50 mcg Oral QAC breakfast  . memantine  10 mg Oral BID  . multivitamin with minerals  1 tablet Oral Daily  . nystatin   Topical BID  . polyethylene glycol  17 g Oral Daily  . sodium chloride  3 mL Intravenous Q12H  . vitamin B-12  1,000 mcg Oral QODAY   Continuous Infusions: . sodium chloride 75 mL/hr at 09/17/13 0715  . dextrose 5 % and 0.45% NaCl 50 mL/hr at 09/18/13 0303     Debbora PrestoMAGICK-MYERS, ISKRA, MD  [facility] Pager [phone]  If 7PM-7AM, please contact night-coverage [email or web address] Password [email or web address] 09/18/2013, 11:15 AM   LOS: 2 days

## 2013-09-18 NOTE — Progress Notes (Signed)
PT Cancellation Note  Patient Details Name: Sheri Stone MRN: [id number] DOB: 1926/09/13   Cancelled Treatment:    Reason Eval/Treat Not Completed: PT screened, no needs identified, will sign off  Per discussion with RN as well as OT notes, pt does not follow commands, nonverbal only moans, hx of dementia, and total assist for care.  Pt resides at SNF and to return upon d/c.  Pt does not appear appropriate for acute PT at this time.  Will defer further needs to SNF.  Thank you.   Sheri Stone,[name] 09/18/2013, 1:37 PM Zenovia JarredKati Rosemaria Inabinet, PT, DPT 09/18/2013 Pager: [phone]

## 2013-09-18 NOTE — Evaluation (Signed)
Clinical/Bedside Swallow Evaluation Patient Details  Name: Sheri Stone MRN: [id number] Date of Birth: [birth year]  Today's Date: 09/18/2013 Time: 4098-1191 SLP Time Calculation (min): 25 min  Past Medical History:  Past Medical History  Diagnosis Date  . Alzheimer's dementia   . UTI (urinary tract infection)   . Thyroid disease   . Shoulder dislocation approx. 2013    left  . Pubic bone fracture approx April 2015   Past Surgical History:  Past Surgical History  Procedure Laterality Date  . Cholecystectomy     HPI:  80 yo female adm from SNF with AMS and edema in neck on right submandibular region.  Pt found to have submandibular abscess, vulvar cellulitis, UTI.  She has dementia and resides in SNF.  Swallow evalution ordered.    Assessment / Plan / Recommendation Clinical Impression  Pt currently lethargic but would open mouth to accept oral care via toothette swabbing and oral suctioning.  Dried secretions noted on hard palate and reddish tinged dried material - nurse tech reports pt had spaghetti yesterday.   Pt did verbalize "yes" when asked if her mouth felt better after oral care.    Anticipate pt to have an adequate swallow ability when she is fully alert as she had no focal cn deficits *from tasks able to eval*.  Also there was not clear clinical evidence of deficits from abscess.    Pt demonstrated ability to transit and swallow thin liquids via tsp without symptoms of aspiration or airway compromise.  Pt able to orally transit thin liquids via tsp without significant delay and adequate laryngeal elevation observed at bedside.   She did not manipulate tsp of applesauce/icecream  requiring SlP to suction from anterior oral cavity.  Agree with MD recommendation for liquid diet only via tsp when fully alert.  Oral care/hygeine important given abscess.    SLP educated nurse tech to recommendations and posted precaution sign in room. Will follow for diet tolerance, determination  of readiness for advancement and family education.    Thanks for this referral.     Aspiration Risk  Severe (if fed when lethargic, mild if fed when fully alert)    Diet Recommendation Thin liquid   Liquid Administration via: Spoon;No straw Medication Administration: Via alternative means (or crushed with liquids if fully alert) Supervision: Trained caregiver to feed patient;Full supervision/cueing for compensatory strategies Compensations: Slow rate;Small sips/bites (oral suction after meals) Postural Changes and/or Swallow Maneuvers: Upright 30-60 min after meal;Seated upright 90 degrees    Other  Recommendations Oral Care Recommendations: Oral care before and after PO (due to submandibular mass ) Other Recommendations:  (do not recommend grits or pudding -  liquids only)   Follow Up Recommendations  Skilled Nursing facility    Frequency and Duration min 2x/week  2 weeks   Pertinent Vitals/Pain Afebrile, decreased    General Date of Onset: 09/18/13 HPI: 80 yo female adm from SNF with AMS and edema in neck on right submandibular region.  Pt found to have submandibular abscess, vulvar cellulitis, UTI.  She has dementia and resides in SNF.  Swallow evalution ordered.  Type of Study: Bedside swallow evaluation Diet Prior to this Study: Thin liquids (full liquids) Temperature Spikes Noted: No Respiratory Status: Room air History of Recent Intubation: No Behavior/Cognition: Lethargic;Decreased sustained attention;Confused;Doesn't follow directions Oral Cavity - Dentition: Adequate natural dentition Self-Feeding Abilities: Total assist Patient Positioning: Upright in bed Baseline Vocal Quality: Clear Volitional Cough: Cognitively unable to elicit Volitional Swallow: Unable to elicit  Oral/Motor/Sensory Function Overall Oral Motor/Sensory Function:  (generalized weakness noted and pt did not follow commands due to lethargy)   Ice Chips Ice chips: Not tested   Thin Liquid Thin  Liquid: Impaired Presentation: Spoon Oral Phase Impairments: Reduced labial seal;Reduced lingual movement/coordination;Impaired anterior to posterior transit Oral Phase Functional Implications: Prolonged oral transit;Oral holding    Nectar Thick Nectar Thick Liquid: Not tested   Honey Thick Honey Thick Liquid: Not tested   Puree Puree: Impaired Presentation: Spoon Oral Phase Impairments: Reduced labial seal;Reduced lingual movement/coordination;Impaired anterior to posterior transit Oral Phase Functional Implications: Oral holding (slp suctioned icecream and applesauce from pt's oral cavity after she did not elicit oral transit/swallow)   Solid   GO    Solid: Not tested       Mills KollerKimball, Sheri Stone Sheri Nowakowski, MS [facility] SLP [phone]

## 2013-09-19 ENCOUNTER — Inpatient Hospital Stay (HOSPITAL_COMMUNITY): Payer: Medicare Other

## 2013-09-19 ENCOUNTER — Encounter (HOSPITAL_COMMUNITY): Payer: Self-pay | Admitting: Radiology

## 2013-09-19 DIAGNOSIS — M79609 Pain in unspecified limb: Secondary | ICD-10-CM

## 2013-09-19 DIAGNOSIS — Z515 Encounter for palliative care: Secondary | ICD-10-CM

## 2013-09-19 DIAGNOSIS — R1310 Dysphagia, unspecified: Secondary | ICD-10-CM

## 2013-09-19 DIAGNOSIS — IMO0002 Reserved for concepts with insufficient information to code with codable children: Secondary | ICD-10-CM

## 2013-09-19 LAB — GLUCOSE, CAPILLARY
Glucose-Capillary: 63 mg/dL — ABNORMAL LOW (ref 70–99)
Glucose-Capillary: 157 mg/dL — ABNORMAL HIGH (ref 70–99)

## 2013-09-19 LAB — BASIC METABOLIC PANEL
Anion gap: 12 (ref 5–15)
Anion gap: 14 (ref 5–15)
BUN: 23 mg/dL (ref 6–23)
BUN: 23 mg/dL (ref 6–23)
CALCIUM: 7 mg/dL — AB (ref 8.4–10.5)
CO2: 19 meq/L (ref 19–32)
CO2: 22 meq/L (ref 19–32)
CREATININE: 0.94 mg/dL (ref 0.50–1.10)
CREATININE: 0.97 mg/dL (ref 0.50–1.10)
Calcium: 7.1 mg/dL — ABNORMAL LOW (ref 8.4–10.5)
Chloride: 107 mEq/L (ref 96–112)
Chloride: 107 mEq/L (ref 96–112)
GFR calc Af Amer: 60 mL/min — ABNORMAL LOW (ref >90)
GFR calc Af Amer: 62 mL/min — ABNORMAL LOW (ref >90)
GFR calc non Af Amer: 52 mL/min — ABNORMAL LOW (ref >90)
GFR calc non Af Amer: 54 mL/min — ABNORMAL LOW (ref >90)
GLUCOSE: 80 mg/dL (ref 70–99)
GLUCOSE: 83 mg/dL (ref 70–99)
Potassium: 3.2 mEq/L — ABNORMAL LOW (ref 3.7–5.3)
Potassium: 5.3 mEq/L (ref 3.7–5.3)
SODIUM: 140 meq/L (ref 137–147)
Sodium: 141 mEq/L (ref 137–147)

## 2013-09-19 LAB — CBC
HCT: 37.3 % (ref 36.0–46.0)
HEMOGLOBIN: 13.1 g/dL (ref 12.0–15.0)
MCH: 33.3 pg (ref 26.0–34.0)
MCHC: 35.1 g/dL (ref 30.0–36.0)
MCV: 94.9 fL (ref 78.0–100.0)
Platelets: 73 10*3/uL — ABNORMAL LOW (ref 150–400)
RBC: 3.93 MIL/uL (ref 3.87–5.11)
RDW: 17.4 % — ABNORMAL HIGH (ref 11.5–15.5)
WBC: 6.1 10*3/uL (ref 4.0–10.5)

## 2013-09-19 MED ORDER — DEXTROSE 50 % IV SOLN
INTRAVENOUS | Status: AC
  Administered 2013-09-19: 50 mL
  Filled 2013-09-19: qty 50

## 2013-09-19 MED ORDER — MUPIROCIN 2 % EX OINT
1.0000 "application " | TOPICAL_OINTMENT | Freq: Two times a day (BID) | CUTANEOUS | Status: DC
  Administered 2013-09-19 – 2013-09-23 (×9): 1 via NASAL
  Filled 2013-09-19: qty 22

## 2013-09-19 MED ORDER — DEXAMETHASONE SODIUM PHOSPHATE 10 MG/ML IJ SOLN
10.0000 mg | Freq: Three times a day (TID) | INTRAMUSCULAR | Status: AC
Start: 2013-09-19 — End: 2013-09-21
  Administered 2013-09-19 – 2013-09-21 (×6): 10 mg via INTRAVENOUS
  Filled 2013-09-19 (×6): qty 1

## 2013-09-19 MED ORDER — SENNOSIDES 8.8 MG/5ML PO SYRP
10.0000 mL | ORAL_SOLUTION | Freq: Every day | ORAL | Status: DC
  Filled 2013-09-19 (×4): qty 10

## 2013-09-19 MED ORDER — OXYCODONE HCL 20 MG/ML PO CONC
5.0000 mg | ORAL | Status: DC | PRN
  Administered 2013-09-19: 5 mg via ORAL
  Filled 2013-09-19: qty 1

## 2013-09-19 MED ORDER — VANCOMYCIN HCL IN DEXTROSE 1-5 GM/200ML-% IV SOLN
1000.0000 mg | Freq: Once | INTRAVENOUS | Status: AC
  Administered 2013-09-19: 1000 mg via INTRAVENOUS
  Filled 2013-09-19: qty 200

## 2013-09-19 MED ORDER — POTASSIUM CHLORIDE IN NACL 20-0.9 MEQ/L-% IV SOLN
INTRAVENOUS | Status: DC
Start: 2013-09-19 — End: 2013-09-22
  Administered 2013-09-19 – 2013-09-20 (×3): via INTRAVENOUS
  Filled 2013-09-19 (×4): qty 1000

## 2013-09-19 MED ORDER — DEXTROSE 50 % IV SOLN: 1.0000 | Freq: Once | INTRAVENOUS | Status: AC

## 2013-09-19 MED ORDER — CHLORHEXIDINE GLUCONATE CLOTH 2 % EX PADS
6.0000 | MEDICATED_PAD | Freq: Every day | CUTANEOUS | Status: AC
  Administered 2013-09-19 – 2013-09-23 (×5): 6 via TOPICAL

## 2013-09-19 MED ORDER — CHLORHEXIDINE GLUCONATE 0.12 % MT SOLN
15.0000 mL | Freq: Four times a day (QID) | OROMUCOSAL | Status: DC
  Administered 2013-09-19 – 2013-09-23 (×11): 15 mL via OROMUCOSAL
  Filled 2013-09-19 (×18): qty 15

## 2013-09-19 MED ORDER — HALOPERIDOL LACTATE 5 MG/ML IJ SOLN
2.0000 mg | INTRAMUSCULAR | Status: DC | PRN
  Administered 2013-09-19 – 2013-09-21 (×3): 2 mg via INTRAVENOUS
  Filled 2013-09-19 (×3): qty 1

## 2013-09-19 MED ORDER — MORPHINE SULFATE 2 MG/ML IJ SOLN
2.0000 mg | INTRAMUSCULAR | Status: DC | PRN
  Administered 2013-09-19 – 2013-09-21 (×7): 2 mg via INTRAVENOUS
  Administered 2013-09-21: 4 mg via INTRAVENOUS
  Administered 2013-09-21 (×3): 2 mg via INTRAVENOUS
  Administered 2013-09-22 (×2): 4 mg via INTRAVENOUS
  Administered 2013-09-22: 2 mg via INTRAVENOUS
  Administered 2013-09-22 – 2013-09-23 (×3): 4 mg via INTRAVENOUS
  Filled 2013-09-19: qty 2
  Filled 2013-09-19 (×2): qty 1
  Filled 2013-09-19: qty 2
  Filled 2013-09-19 (×2): qty 1
  Filled 2013-09-19 (×4): qty 2
  Filled 2013-09-19 (×7): qty 1

## 2013-09-19 MED ORDER — VANCOMYCIN HCL 500 MG IV SOLR
500.0000 mg | INTRAVENOUS | Status: DC
  Administered 2013-09-20 – 2013-09-22 (×3): 500 mg via INTRAVENOUS
  Filled 2013-09-19 (×3): qty 500

## 2013-09-19 MED ORDER — PIPERACILLIN-TAZOBACTAM 3.375 G IVPB
3.3750 g | Freq: Three times a day (TID) | INTRAVENOUS | Status: DC
  Administered 2013-09-19 – 2013-09-23 (×13): 3.375 g via INTRAVENOUS
  Filled 2013-09-19 (×14): qty 50

## 2013-09-19 MED ORDER — IOHEXOL 300 MG/ML  SOLN
100.0000 mL | Freq: Once | INTRAMUSCULAR | Status: AC | PRN
  Administered 2013-09-19: 100 mL via INTRAVENOUS

## 2013-09-19 MED ORDER — POTASSIUM CHLORIDE CRYS ER 20 MEQ PO TBCR: 40.0000 meq | EXTENDED_RELEASE_TABLET | Freq: Once | ORAL | Status: DC

## 2013-09-19 NOTE — Progress Notes (Signed)
Hypoglycemic Event  CBG: 63  Treatment: D50  Symptoms: asymptomatic  Follow-up CBG: Time:1747 CBG Result:157  Possible Reasons for Event: pt unable to swallow / NPO  Comments/MD notified: Dr. Izola PriceMyers made aware    Verdon CumminsBRANNAN, MELISSA G  Remember to initiate Hypoglycemia Order Set & complete

## 2013-09-19 NOTE — Consult Note (Addendum)
Shelby Dubinarker, Alaena [id number] 24-Jul-1926 Dorothea OgleIskra M Myers, MD  Reason for Consult: bilateral acute submandibular sialoadenitis  HPI: 80yo admitted for bilateral submandibular swelling. Ct neck 10/17/13 showed bilateral submandibular enlargement with multiple hypodensities vs. Possible microabscesses. Patient was treated with clindamycin. Had repeat neck Ct 09/19/13 showing persistent hypodensities vs. Microabscesses, perhaps somewhat larger with persistent bilateral submandibular enlargement. ENT consulted for the above.  Allergies: No Known Allergies  ROS: Review of systems + for submandibular swelling,otherwise negative x 12 systems except per HPI.  PMH:  Past Medical History  Diagnosis Date  . Alzheimer's dementia   . UTI (urinary tract infection)   . Thyroid disease   . Shoulder dislocation approx. 2013    left  . Pubic bone fracture approx April 2015    FH: History reviewed. No pertinent family history.  SH:  History   Social History  . Marital Status: Divorced    Spouse Name: N/A    Number of Children: N/A  . Years of Education: N/A   Occupational History  . Not on file.   Social History Main Topics  . Smoking status: Never Smoker   . Smokeless tobacco: Never Used  . Alcohol Use: No  . Drug Use: No  . Sexual Activity: Not on file   Other Topics Concern  . Not on file   Social History Narrative  . No narrative on file    PSH:  Past Surgical History  Procedure Laterality Date  . Cholecystectomy      Physical  Exam: CN 2-12 grossly intact and symmetric. Facial nerve House-Brackmann 1/6 bilaterally in all divisions. EAC/TMs normal BL. Oral cavity, lips, gums, ororpharynx with no masses or lesions. Has poor dentition. Skin warm and dry. Nasal cavity without polyps or purulence. External nose and ears without masses or lesions. EOMI, PERRLA. Neck with bilateral R>L submandibular gland edema, enlargement,  and firm induration. No overlying cellulitis or fluctuance  palpated.   A/P: bilateral R>L submandibular acute sialoadenitis with hypodensities/edema vs. Small microabscesses. There does not appear to be a large fluctuant abscess and her white count is normal and the CT from 7/21 to 7/24 does not show a marked progression over 3-4 days, so would recommend IV or oral hydration, chlorhexidine oral rinse, continuing aggressive medical therapy with IV vancomycin and zosyn, and adding TID decadron x 48 hours with sialogogues and warm compresses. Removal/drainage of the multiple microabscesses would require removal of both submandibular glands, which would be technically difficult with the thrombocytopenia, acute infection/inflammation and with patient on Lovenox and would have a high likelihood of hematoma and injury to the marginal mandibular branch of the facial nerve, and the patient may be a high operative risk. Would continue with aggressive medical therapy and the infection/inflammation should resolve with observation.   Melvenia BeamGore, Zakeya Junker 09/19/2013 3:45 PM

## 2013-09-19 NOTE — Progress Notes (Signed)
Speech Language Pathology Treatment: Dysphagia  Patient Details Name: Sheri Stone MRN: [id number] DOB: 07/06/1926 Today's Date: 09/19/2013 Time: 4098-11911055-1118 SLP Time Calculation (min): 23 min  Assessment / Plan / Recommendation Clinical Impression  RN reported pt has either been crying out in apparent pain, or too lethargic to attempt po's, and that oral cavity is very painful (based on pt response to oral care). RN assisted SLP to reposition pt to upright position. SLP felt pt appropriate for po trial.  Oral care completed delicately by SLP, which pt appeared to tolerate. Pt given a few drops of thin liquid via tsp, which pt appeared interested in, with lip closure and lingual manipulation of the water. Pt was then given a small ice chip, which elicited no oral manipulation and no obvious swallow reflex, but did result in immediate cough response. No further po trials were presented. At this time, pt is unsafe for po intake, given poor alertness and lack of oral manipulation. RN was informed of pt's performance. Safe swallow precautions re: po presentation of thin liquids via tsp only when pt is fully awake and alert are posted in pt room. ST to continue to follow per POC.   HPI HPI: 80 yo female adm from SNF with AMS and edema in neck on right submandibular region.  Pt found to have submandibular abscess, vulvar cellulitis, UTI.  She has dementia and resides in SNF.  Swallow evalution ordered. Pt placed on tsp of thin liquid only when alert. ST in to assess po tolerance   Pertinent Vitals VSS; Pain rated as 6 on PAINAD scale  SLP Plan  Continue with current plan of care    Recommendations Diet recommendations: NPO (unless fully alert. Thin liquids by tsp when alert) Liquids provided via: Teaspoon Medication Administration: Via alternative means Supervision: Trained caregiver to feed patient;Full supervision/cueing for compensatory strategies (only when pt is alert) Compensations: Slow  rate;Small sips/bites (oral care prior to meals, oral suction after meals) Postural Changes and/or Swallow Maneuvers: Seated upright 90 degrees;Upright 30-60 min after meal              Oral Care Recommendations: Oral care before and after PO Follow up Recommendations: Skilled Nursing facility Plan: Continue with current plan of care    GO   Gearl Stone Sheri Stone, [facility] Medical CenterMSP, CCC-SLP [phone]   Sheri Stone, Clelia Trabucco Stone 09/19/2013, 11:27 AM

## 2013-09-19 NOTE — Progress Notes (Signed)
Clinical Social Work Department BRIEF PSYCHOSOCIAL ASSESSMENT 09/19/2013  Patient:  Sheri Stone,Sheri Stone     Account Number:  [id number]     Admit date:  09/16/2013  Clinical Social Worker:  Orpah GreekFOLEY,Jin Capote, LCSWA  Date/Time:  09/19/2013 10:04 AM  Referred by:  Physician  Date Referred:  09/19/2013 Referred for  Other - See comment   Other Referral:   Ad[facility] from[facility]   Interview type:  Family Other interview type:   patient's son, Sheri Stone & daughter, Sheri Stone via phone    PSYCHOSOCIAL DATA Living Status:  FACILITY Admitted from facility[facility]  [facility] Level of care:  Skilled Nursing Facility Primary support name:  Sheri BrownRobert Stone (son) h#: [phone] c#: [id number] Primary support relationship to patient:  CHILD, ADULT Degree of support available:   good    CURRENT CONCERNS Current Concerns  Post-Acute Placement   Other Concerns:    SOCIAL WORK ASSESSMENT / PLAN CSW received consult that patient was [facility].   Assessment/plan status:  Information/[facility] to [facility] Other assessment/ plan:   Information/referral to community resources:   CSW completed FL2 and [facility], [name] that [facility] would be able to take her back when stable.    PATIENT'S/FAMILY'S RESPONSE TO PLAN OF CARE: CSW spoke with patient's daughter, Sheri Stone who directed questions to her brother & patient's HCPOA, Sheri Stone who confirmed that the plan is for [facility] to [facility]. [facility] but plan was for long term care. Note that palliative care consult was ordered, CSW will follow-up after meeting.       [name], LCSW [name] Clinical Social Worker cell #: [phone]

## 2013-09-19 NOTE — Progress Notes (Signed)
OT Cancellation Note  Patient Details Name: Charlann LangeMary E Trafton MRN: [id number] DOB: 1927/01/18   Cancelled Treatment:    Reason Eval/Treat Not Completed: Other (comment).  Received new OT order.  Pt was seen for one time eval yesterday.  Pt needs total A for all adls.  Defer further OT to SNF.  Geordan Xu 09/19/2013, 3:23 PM Marica OtterMaryellen Marge Vandermeulen, OTR/L [phone] 09/19/2013

## 2013-09-19 NOTE — Consult Note (Signed)
Patient ZO:XWRU BRYAR Stone      DOB: 18-Mar-1926      EAV:[id number]     Consult Note from the Palliative Medicine Team at [facility]    Consult Requested by: Dr Izola Price     PCP: Pcp Not In System Reason for Consultation: Symptom Management, Goals of Care     Phone Number:None  Assessment/Recommendations   1.  Code Status: Full  2.Goals of Care: Per discussion via phone with son today, Sheri Stone certainly has struggled over past few months especially.  Was ambulatory with walker until pelvic fracture in April after fall per Sheri Stone.  Since then has been bed bound and progressive cognitive decline with her not opening eyes and being non-communicative over past few months as well. His sister Sheri Stone also involved in care decisions and receiving updates.   Will meet together tomorrow between 1-2PM to discuss her care and goals. From what son tells me, she is at least a FAST 7c at baseline from dementia standpoint and from a dementia standpoint alone her Sheri Stone Mortality Risk Index Score puts her at a relatively high risk (~40%) of death in next 6 months. That is not even taking into account the serious issues she has currently.    Repeat imaging shows continued abscess which may not significantly improve without drainage. Unclear cause of elevated bili (?infxn). Will hopefully more clearly establish goals tomorrow.   Sheri Stone is concerned about social security check which has not been released since she was at previous nursing facility as Sheri Stone is now non-verbal and unable to consent to her finances.     3. Symptom Management:   1. Anxiety/Agitation/Delirium: Will d/c ativan as this could make her underlying dementia/delirium worse.  Will go ahead and use haldol 2mg  IV prn since she is having difficulty swallowing.  Please contact me if symptomatic agitation becoming difficult. Need to ensure adequate treatment of pain, so that pain is not contributing to agitation/delirium given that she will not be able  to provide insight into these symptoms 2. Pain: On PRN morphine. Dose increased from 1mg  to 2-4mg  PRN as she did not respond much to this.  Not able to swallow pills. Can use oxycodone intensol which can be absorbed sublingual.  I have placed order for this PRN as well.  3. Bowel Regimen: daily miralax which she has not been able to take with mentation. Will start senna syrup with opioid use. This should be easier to swallow 4. Dysphagia- thin liquids when alert per speech.  With what I am hearing about severity of her dementia from her son, I do not think she would benefit from PEG tube placement (no evidence that this will improve survival or decrease risk of aspiration). Cachexia on exam indicative of likely ongoing weight loss almost certainyly related to severe dementia.   4. Psychosocial: Was living at [facility] nursing facility prior to this admission.  Before that was at [facility].   5. Spiritual: Patient unable to provide history.  Will see if we can broach in family meeting tomorrow.    Brief HPI: Patient is an 80 yo female with PMHx of dementia, falls, recent pelvic fracture who presented on 09/16/13 with agitation and submandibular mass.  History obtained from chart and son as she is non-verbal.  Son tells me that his mom has not been doing well for past several months. Was at [facility] nursing home where she had a fall with pelvic fracture in April of this year. Prior to  this was ambulating with walker, and bedbound since.  This also has seemed to precipitate cognitive decline as well. Over past 2 months does not really open eyes much and has become nonverbal.  Her repeat CT scans here have showed continued abscess  In discussion with bedside nurse, she has had episodes of agitation moaning. Has not responded well to 1mg  of IV morphine. Seems to calm with ativan.     ROS: Unable to obtain secondary to cognitive impairment.    PMH:  Past Medical History  Diagnosis Date  .  Alzheimer's dementia   . UTI (urinary tract infection)   . Thyroid disease   . Shoulder dislocation approx. 2013    left  . Pubic bone fracture approx April 2015     PSH: Past Surgical History  Procedure Laterality Date  . Cholecystectomy     I have reviewed the FH and SH and  If appropriate update it with new information. No Known Allergies Scheduled Meds: . cefTRIAXone (ROCEPHIN)  IV  1 g Intravenous Q24H  . Chlorhexidine Gluconate Cloth  6 each Topical Q0600  . clindamycin (CLEOCIN) IV  900 mg Intravenous 3 times per day  . enoxaparin (LOVENOX) injection  40 mg Subcutaneous Q24H  . feeding supplement (ENSURE COMPLETE)  237 mL Oral TID WC  . fluticasone  2 spray Each Nare Daily  . levothyroxine  50 mcg Oral QAC breakfast  . memantine  10 mg Oral BID  . multivitamin with minerals  1 tablet Oral Daily  . mupirocin ointment  1 application Nasal BID  . nystatin   Topical BID  . polyethylene glycol  17 g Oral Daily  . sodium chloride  3 mL Intravenous Q12H  . vitamin B-12  1,000 mcg Oral QODAY   Continuous Infusions: . 0.9 % NaCl with KCl 20 mEq / L 50 mL/hr at 09/19/13 0402   PRN Meds:.acetaminophen, haloperidol lactate, morphine injection, oxyCODONE, zolpidem    BP 136/79  Pulse 96  Temp(Src) 97.7 F (36.5 C) (Oral)  Resp 24  Ht 5\' 2"  (1.575 m)  Wt 55.7 kg (122 lb 12.7 oz)  BMI 22.45 kg/m2  SpO2 92%   PPS: 20-30 at baseline   Intake/Output Summary (Last 24 hours) at 09/19/13 1200 Last data filed at 09/19/13 0700  Gross per 24 hour  Intake 598.33 ml  Output   1950 ml  Net -1351.67 ml    Physical Exam:  General: Resting, no actue distress. Not interactive and non-verbal HEENT:  [place], dry mm NECK: cachectic with sunken clavicles Chest:  CTAB, symm expansion CVS: RRR Abdomen: soft, +BS, moans some with deep palp Ext: no edema Neuro: does not follow commands. Moans to tactile stim, PERRL SKIN: warm/dry  Labs: CBC    Component Value Date/Time   WBC  6.1 09/19/2013 0051   RBC 3.93 09/19/2013 0051   HGB 13.1 09/19/2013 0051   HCT 37.3 09/19/2013 0051   PLT 73* 09/19/2013 0051   MCV 94.9 09/19/2013 0051   MCH 33.3 09/19/2013 0051   MCHC 35.1 09/19/2013 0051   RDW 17.4* 09/19/2013 0051   LYMPHSABS 1.5 09/16/2013 1850   MONOABS 0.5 09/16/2013 1850   EOSABS 0.0 09/16/2013 1850   BASOSABS 0.0 09/16/2013 1850    BMET    Component Value Date/Time   NA 140 09/19/2013 0800   K 5.3 09/19/2013 0800   CL 107 09/19/2013 0800   CO2 19 09/19/2013 0800   GLUCOSE 80 09/19/2013 0800   BUN 23 09/19/2013  0800   CREATININE 0.94 09/19/2013 0800   CALCIUM 7.1* 09/19/2013 0800   GFRNONAA 54* 09/19/2013 0800   GFRAA 62* 09/19/2013 0800    CMP     Component Value Date/Time   NA 140 09/19/2013 0800   K 5.3 09/19/2013 0800   CL 107 09/19/2013 0800   CO2 19 09/19/2013 0800   GLUCOSE 80 09/19/2013 0800   BUN 23 09/19/2013 0800   CREATININE 0.94 09/19/2013 0800   CALCIUM 7.1* 09/19/2013 0800   PROT 6.3 09/18/2013 0040   ALBUMIN 1.7* 09/18/2013 0040   AST 58* 09/18/2013 0040   ALT 44* 09/18/2013 0040   ALKPHOS 437* 09/18/2013 0040   BILITOT 4.3* 09/18/2013 0040   GFRNONAA 54* 09/19/2013 0800   GFRAA 62* 09/19/2013 0800   09/16/13 CT Neck: IMPRESSION:  Marked enlargement of the submandibular gland bilaterally with  increased enhancement and surrounding edema suggesting acute  infection. Multiple small fluid collections are present in both  submandibular glands suggesting multiple micro abscesses. These all  measure under 1 cm.   09/19/13 CT Neck: IMPRESSION:  1. The submandibular glands remain enlarged and inflamed. The  microabscesses are more prominent today with some measuring over 1  cm in diameter.  2. There is no lymphadenopathy nor evidence of inflammatory change  elsewhere within the neck.    Time In: 1100 Time Out: 1228 Total time: 88 min  Greater than 50%  of this time was spent counseling and coordinating care related to the above assessment and  plan.    Orvis Brill D.O. Palliative Medicine Team at [facility]  Pager: [phone] Team Phone: [phone]

## 2013-09-19 NOTE — Clinical Documentation Improvement (Signed)
Possible Clinical Conditions? _______Dementia with behavioral disturbances  _______Other Condition__________________ _______Cannot Clinically Determine   Supporting Information: Per H&P = patient she is extremely agitated and crying out.  Pt is extremely distressed and agitated and unable to cooperate with exam. Agitation - IV Haldol as needed as well as treat pt discomfort.     Thank You, Shelda Palarlene H Cooper ,RN Clinical Documentation Specialist:  [phone]  [facility]- Health Information Management

## 2013-09-19 NOTE — Progress Notes (Signed)
Patient ID: Sheri Stone, female   DOB: 16-Jan-1927, 80 y.o.   MRN: [id number]  [facility] PROGRESS NOTE  Sheri Stone YQM:[id number] DOB: 1926/09/03 DOA: 09/16/2013 PCP: Pcp Not In System  Brief narrative:  80 yr old woman who was sent from the nursing facility for an increasing submandibular mass. Pt has dementia at baseline and was unable to provide nay history. Non verbal on admission.   Active Problems:  Abscess of submandibular gland  - currently on Clindamycin day #4 - abscess looking worse on repeat CT neck - escalate ABX coverage to Vanc and Zosyn for now - source likely from poor dentition, may need dental consult as well  - will consult ENT/Mandible specialist for further input  UTI (urinary tract infection)  - urine culture negative  - stop rocephin  Vulvar cellulitis  - current abx should be adequate  - provide barrier cream  - keep foley for now  Dementia, advanced  - will likely go to SNF upon discharge  - PCT consult for goals of care discussion  Protein-calorie malnutrition, severe  - SLP evaluation and advancing diet as pt able to tolerate  - pt still minimally cooperative and unable to feed herself, requires assistance with ADL Leukocytosis  - secondary to abscess as noted above  - ABX as noted above, WBC WNL this AM  - repeat CBC in AM  Thrombocytopenia  - monitor closely  - Lovenox for DVT prophylaxis  - CBC In AM  Transaminitis  - CMET in AM  DVT prophylaxis  - Lovenox SQ   Consultants:  None  Procedures/Studies:  Ct Soft Tissue Neck W Contrast 09/16/2013 Marked enlargement of the submandibular gland bilaterally with increased enhancement and surrounding edema suggesting acute infection. Multiple small fluid collections are present in both submandibular glands suggesting multiple micro abscesses. These all measure under 1 cm.  Antibiotics:  Clindamycin 7/21 --> 7/24 Rocephin 7/22 --> 7/24 Vancomycin 7/24 --> Zosyn 7/24 -->  Code Status:  Full  Family Communication: Daughter over the phone  Disposition Plan: SNF when ready    HPI/Subjective: No events overnight.   Objective: Filed Vitals:   09/18/13 1433 09/18/13 2050 09/19/13 0607 09/19/13 1000  BP: 121/77 125/73 133/90 136/79  Pulse: 100 108 105 96  Temp: 97.4 F (36.3 C) 97.8 F (36.6 C) 98 F (36.7 C) 97.7 F (36.5 C)  TempSrc: Axillary Axillary Axillary Oral  Resp: 22 24 24 24   Height:      Weight:      SpO2: 95% 94% 92%     Intake/Output Summary (Last 24 hours) at 09/19/13 1448 Last data filed at 09/19/13 0700  Gross per 24 hour  Intake 598.33 ml  Output   1600 ml  Net -1001.67 ml    Exam:   General:  Pt is moaning and non verbal, not following any commands   Cardiovascular: Regular rhythm, tachycardic, no rubs, no gallops  Respiratory: Clear to auscultation bilaterally, diminished air sounds at bases   Abdomen: Soft, non tender, non distended, bowel sounds present, no guarding  Data Reviewed: Basic Metabolic Panel:  Recent Labs Lab 09/16/13 1850 09/17/13 0750 09/18/13 0040 09/19/13 0051 09/19/13 0800  NA 140 141 142 141 140  K 3.8 4.4 4.0 3.2* 5.3  CL 107 106 109 107 107  CO2 24 24 20 22 19   GLUCOSE 102* 76 71 83 80  BUN 25* 25* 29* 23 23  CREATININE 0.72 0.75 0.97 0.97 0.94  CALCIUM 8.1* 7.9* 7.5* 7.0*  7.1*   Liver Function Tests:  Recent Labs Lab 09/16/13 1850 09/18/13 0040  AST 51* 58*  ALT 50* 44*  ALKPHOS 444* 437*  BILITOT 4.8* 4.3*  PROT 6.3 6.3  ALBUMIN 1.8* 1.7*   CBC:  Recent Labs Lab 09/16/13 1850 09/17/13 0750 09/18/13 0040 09/19/13 0051  WBC 12.5* 9.7 9.4 6.1  NEUTROABS 10.5*  --   --   --   HGB 13.5 13.3 13.4 13.1  HCT 38.4 37.9 37.3 37.3  MCV 94.8 96.9 94.2 94.9  PLT 92* 73* 93* 73*   Recent Results (from the past 240 hour(s))  URINE CULTURE     Status: None   Collection Time    09/17/13  3:01 PM      Result Value Ref Range Status   Specimen Description URINE, CLEAN CATCH   Final    Special Requests NONE   Final   Culture  Setup Time     Final   Value: 09/17/2013 19:19     Performed at [facility]   Colony Count     Final   Value: NO GROWTH     Performed at [facility]   Culture     Final   Value: NO GROWTH     Performed at [facility]   Report Status 09/18/2013 FINAL   Final  MRSA PCR SCREENING     Status: Abnormal   Collection Time    09/17/13 10:12 PM      Result Value Ref Range Status   MRSA by PCR POSITIVE (*) NEGATIVE Final   Comment:            The GeneXpert MRSA Assay (FDA     approved for NASAL specimens     only), is one component of a     comprehensive MRSA colonization     surveillance program. It is not     intended to diagnose MRSA     infection nor to guide or     monitor treatment for     MRSA infections.     RESULT CALLED TO, READ BACK BY AND VERIFIED WITH:     BRALLEY,B RN AT 2334 07.22.15 BY TIBBITTS,K     Scheduled Meds: . cefTRIAXone (ROCEPHIN)  IV  1 g Intravenous Q24H  . Chlorhexidine Gluconate Cloth  6 each Topical Q0600  . clindamycin (CLEOCIN) IV  900 mg Intravenous 3 times per day  . enoxaparin (LOVENOX) injection  40 mg Subcutaneous Q24H  . feeding supplement (ENSURE COMPLETE)  237 mL Oral TID WC  . fluticasone  2 spray Each Nare Daily  . levothyroxine  50 mcg Oral QAC breakfast  . memantine  10 mg Oral BID  . multivitamin with minerals  1 tablet Oral Daily  . mupirocin ointment  1 application Nasal BID  . nystatin   Topical BID  . sennosides  10 mL Oral QHS  . sodium chloride  3 mL Intravenous Q12H  . vitamin B-12  1,000 mcg Oral QODAY   Continuous Infusions: . 0.9 % NaCl with KCl 20 mEq / L 50 mL/hr at 09/19/13 [id number]   Debbora PrestoMAGICK-Aricela Bertagnolli, MD  [facility] Pager [phone]  If 7PM-7AM, please contact night-coverage [email or web address] Password [email or web address] 09/19/2013, 2:48 PM   LOS: 3 days

## 2013-09-19 NOTE — Progress Notes (Signed)
ANTIBIOTIC CONSULT NOTE - INITIAL  Pharmacy Consult for vancomcyin/zosyn Indication: cellulitis/abscess  No Known Allergies  Patient Measurements: Height: 5\' 2"  (157.5 cm) (estimate, pt. nonverbal) Weight: 122 lb 12.7 oz (55.7 kg) IBW/kg (Calculated) : 50.1 Adjusted Body Weight:   Vital Signs: Temp: 97.7 F (36.5 C) (07/24 1000) Temp src: Oral (07/24 1000) BP: 136/79 mmHg (07/24 1000) Pulse Rate: 96 (07/24 1000) Intake/Output from previous day: 07/23 0701 - 07/24 0700 In: 598.3 [I.V.:448.3; IV Piggyback:150] Out: 2250 [Urine:2250] Intake/Output from this shift:    Labs:  Recent Labs  09/17/13 0750 09/18/13 0040 09/19/13 0051 09/19/13 0800  WBC 9.7 9.4 6.1  --   HGB 13.3 13.4 13.1  --   PLT 73* 93* 73*  --   CREATININE 0.75 0.97 0.97 0.94   Estimated Creatinine Clearance: 34.6 ml/min (by C-G formula based on Cr of 0.94). No results found for this basename: Rolm Gala, VANCORANDOM, GENTTROUGH, GENTPEAK, GENTRANDOM, TOBRATROUGH, TOBRAPEAK, TOBRARND, AMIKACINPEAK, AMIKACINTROU, AMIKACIN,  in the last 72 hours   Microbiology: Recent Results (from the past 720 hour(s))  URINE CULTURE     Status: None   Collection Time    09/17/13  3:01 PM      Result Value Ref Range Status   Specimen Description URINE, CLEAN CATCH   Final   Special Requests NONE   Final   Culture  Setup Time     Final   Value: 09/17/2013 19:19     Performed at [facility] Count     Final   Value: NO GROWTH     Performed at [facility]   Culture     Final   Value: NO GROWTH     Performed at [facility]   Report Status 09/18/2013 FINAL   Final  MRSA PCR SCREENING     Status: Abnormal   Collection Time    09/17/13 10:12 PM      Result Value Ref Range Status   MRSA by PCR POSITIVE (*) NEGATIVE Final   Comment:            The GeneXpert MRSA Assay (FDA     approved for NASAL specimens     only), is one component of a     comprehensive MRSA  colonization     surveillance program. It is not     intended to diagnose MRSA     infection nor to guide or     monitor treatment for     MRSA infections.     RESULT CALLED TO, READ BACK BY AND VERIFIED WITH:     BRALLEY,B RN AT 2334 07.22.15 BY TIBBITTS,K    Medical History: Past Medical History  Diagnosis Date  . Alzheimer's dementia   . UTI (urinary tract infection)   . Thyroid disease   . Shoulder dislocation approx. 2013    left  . Pubic bone fracture approx April 2015    Assessment: 79 YOF with abscess of submandibular gland and vulvar abscess, on clindamycin since 7/22. Orders to start vancomycin and zosyn.   7/22 >> clindamycin  >> 7/24 7/24 >> vancomycin >> 7/24  >>  Zosyn >>  Tmax: afeb WBCs: 6.1 Renal: Scr = 0.94, normalized CrCL = 26ml/min  7/22 urine: NG   Goal of Therapy:  Vancomycin trough level 10-15 mcg/ml  Plan:   Vancomycin 1gm IV x 1 then 500mg  IV q24h  Check vancomycin trough at steady state  Zosyn 3.375gm IV q8h over 4h  infusion  Juliette Alcideustin Brecken Dewoody, PharmD, BCPS.   Pager: [phone]  09/19/2013,2:53 PM

## 2013-09-20 DIAGNOSIS — F0390 Unspecified dementia without behavioral disturbance: Secondary | ICD-10-CM

## 2013-09-20 LAB — CBC
HEMATOCRIT: 38.5 % (ref 36.0–46.0)
Hemoglobin: 13.6 g/dL (ref 12.0–15.0)
MCH: 34 pg (ref 26.0–34.0)
MCHC: 35.3 g/dL (ref 30.0–36.0)
MCV: 96.3 fL (ref 78.0–100.0)
Platelets: 71 10*3/uL — ABNORMAL LOW (ref 150–400)
RBC: 4 MIL/uL (ref 3.87–5.11)
RDW: 17.7 % — ABNORMAL HIGH (ref 11.5–15.5)
WBC: 3.9 10*3/uL — AB (ref 4.0–10.5)

## 2013-09-20 LAB — BASIC METABOLIC PANEL
Anion gap: 9 (ref 5–15)
BUN: 20 mg/dL (ref 6–23)
CHLORIDE: 108 meq/L (ref 96–112)
CO2: 24 mEq/L (ref 19–32)
Calcium: 7.2 mg/dL — ABNORMAL LOW (ref 8.4–10.5)
Creatinine, Ser: 0.87 mg/dL (ref 0.50–1.10)
GFR calc Af Amer: 68 mL/min — ABNORMAL LOW (ref >90)
GFR calc non Af Amer: 59 mL/min — ABNORMAL LOW (ref >90)
GLUCOSE: 137 mg/dL — AB (ref 70–99)
Potassium: 4.6 mEq/L (ref 3.7–5.3)
Sodium: 141 mEq/L (ref 137–147)

## 2013-09-20 MED ORDER — LORAZEPAM 2 MG/ML IJ SOLN
1.0000 mg | Freq: Once | INTRAMUSCULAR | Status: AC
  Administered 2013-09-20: 1 mg via INTRAVENOUS
  Filled 2013-09-20: qty 1

## 2013-09-20 MED ORDER — BISACODYL 10 MG RE SUPP: 10.0000 mg | Freq: Every day | RECTAL | Status: DC | PRN

## 2013-09-20 NOTE — Progress Notes (Signed)
Patient Sheri Stone      DOB: Jun 29, 1926      VZS:[id number]   Palliative Medicine Team at [facility] Progress Note    Subjective: Patient mumbles few words, sleeping most of day per nursing. Unable to provide me any history.   Met with son Sheri Stone and Daughter Sheri Stone today to discuss goals as below.      Filed Vitals:   09/20/13 0558  BP: 145/83  Pulse: 98  Temp: 98.2 F (36.8 C)  Resp: 20   Physical exam: GEN: occassionally mumbles or moans, no eye opening, does not follow commands HEENT: [place], dry mmm NECK: submandibular abscess palpable bilaterally CV: RRR LUNGS: CTAB ant, symm expan ABD: soft, ND, +BS EXT: no c/c/e    Assessment and plan: 81 yo female with advanced dementia presenting with submandibular abscess, UTI, encephalopathy  1. Code Status: Full   2.Goals of Care: See initial consult note.  Spoke with Sheri Stone and [place] today at length.  They understand current situation with submandibular abscess, UTI.  Sheri Stone stated that Sheri Stone's decline has been more in the past 2 weeks and "sudden" rather than decline over past 2 months as he seemed to indicate yesterday. States that before 1-2 weeks ago, was non-ambulatory but able to communicate some "though didn't make much sense" and did recognize family members. They also acknowledged that she had been losing weight and not eating unless she received considerable assistance from nursing staff to feed. They feel like her submandibualr abscess is doing much better and are hopeful she will get over this acute process. We talked about the disease course with advanced dementia and how worrisome her weight loss, lack of ambulation, and functional decline is.  We talked about end stages of dementia and what that looks like and my concern that Sheri Stone is approaching the end of her life.   When I asked Sheri Stone and Sheri Stone about concerns they had for [facility] future and what things would look like, they tell me they wanted her to get back  to baseline and want to do all they can to get her there. They also responded to that question with "she is full code".  Sheri Stone has not ever had living will or talked about this per their knowledge.  I reframed this as making decisions about things like CPR/intubation around what we hoped to achieve by doing these things.  I informed them of my belief that if this were to happen it is because Sheri Stone's life was coming to an end and doing those things would not help her return to being able to recognize family, enjoy eating, communicating.  They state that they will at least think about these things and see how her trajectory goes here.   They wish to do whatever they can here in hopes of getting her back close to her baseline. They acknowledge that this will be very difficult for her to do.  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   Did not speak specifically to temporary feeding tube issue with family, but suspect at this point they would pursue if offered. I do not think a PEG tube is medically indicated with the advance stage of her dementia as literature does not show decreased risk of aspiration or prolong survival.    3. Symptom Management:  1. Anxiety/Agitation/Delirium: Continue haldol PRN (2 doses). Avoid benzo's 2. Pain: PRN morphine x2. Oxy  PRN x1. Seems controlled. Monitor.  3. Bowel Regimen: not able to take oral meds consistently at moment. Will order PRN suppository 4. Dysphagia- thin liquids when alert per speech. With what I am hearing about severity of her dementia from her son, I do not think she would benefit from PEG tube placement (no evidence that this will improve survival or decrease risk of aspiration). Cachexia on exam indicative of likely ongoing weight loss almost certainyly related to severe dementia.   4. Psychosocial: Was living at [place] facility prior to this  admission. Before that was at [facility].  Family has concerns related to her social security check. i am not sure how or if we can help them with this. I have placed consult to SW to point family in right direction.     Time In: 1135  Time Out: 1215 Total time: 40 min   Greater than 50% of this time was spent counseling and coordinating care related to the above assessment and plan.  Doran Clay D.O. Palliative Medicine Team at [facility]  Pager: [phone] Team Phone: [phone]

## 2013-09-20 NOTE — Progress Notes (Signed)
Patient ID: Sheri Stone, female   DOB: 1926/11/04, 80 y.o.   MRN: [id number]  [facility] PROGRESS NOTE  Sheri Stone [id number]:[id number] DOB: 1926/11/04 DOA: 09/16/2013 PCP: Pcp Not In System  Brief narrative:  80 yr old woman who was sent from the nursing facility for an increasing submandibular mass. Pt has dementia at baseline and was unable to provide nay history. Non verbal on admission.   Active Problems:  Abscess of submandibular gland  - completed Clindamycin for 4 days  - escalate ABX coverage to Vanc and Zosyn July 24th, 2015 - source likely from poor dentition - appreciated ENT input  UTI (urinary tract infection)  - urine culture negative  - stop rocephin July 24th, 2015  Vulvar cellulitis  - current abx should be adequate  - provide barrier cream  - keep foley for now  Dementia, advanced  - will likely go to SNF upon discharge  - PCT consult for goals of care discussion  Protein-calorie malnutrition, severe  - SLP evaluation and advancing diet as pt able to tolerate  - pt still minimally cooperative and unable to feed herself, requires assistance with ADL  - PCT consult appreciated  Leukocytosis  - secondary to abscess as noted above  - ABX as noted above, WBC WNL this AM  - repeat CBC in AM  Thrombocytopenia  - monitor closely  - Lovenox for DVT prophylaxis  - CBC In AM  Transaminitis  - stable overall  DVT prophylaxis  - Lovenox SQ   Consultants:  None  Procedures/Studies:  Ct Soft Tissue Neck W Contrast 09/16/2013 Marked enlargement of the submandibular gland bilaterally with increased enhancement and surrounding edema suggesting acute infection. Multiple small fluid collections are present in both submandibular glands suggesting multiple micro abscesses. These all measure under 1 cm.  Antibiotics:  Clindamycin 7/21 --> 7/24  Rocephin 7/22 --> 7/24  Vancomycin 7/24 -->  Zosyn 7/24 -->  Code Status: Full  Family Communication: Daughter over the  phone  Disposition Plan: SNF when ready   HPI/Subjective: No events overnight.   Objective: Filed Vitals:   09/19/13 1000 09/19/13 1400 09/19/13 2203 09/20/13 0558  BP: 136/79 136/79 131/97 145/83  Pulse: 96 96 101 98  Temp: 97.7 F (36.5 C) 97.4 F (36.3 C) 97.5 F (36.4 C) 98.2 F (36.8 C)  TempSrc: Oral Axillary Axillary Axillary  Resp: 24 20 19 20   Height:      Weight:      SpO2: 100% 100% 100%     Intake/Output Summary (Last 24 hours) at 09/20/13 1134 Last data filed at 09/20/13 0500  Gross per 24 hour  Intake    800 ml  Output   2450 ml  Net  -1650 ml    Exam:   General:  Pt is still somnolent and confused with dementia   Cardiovascular: Regular rate and rhythm, no rubs, no gallops  Respiratory: Clear to auscultation bilaterally, no wheezing, diminished breath sounds at bases   Abdomen: Soft, non tender, non distended, bowel sounds present, no guarding   Data Reviewed: Basic Metabolic Panel:  Recent Labs Lab 09/17/13 0750 09/18/13 0040 09/19/13 0051 09/19/13 0800 09/20/13 0020  NA 141 142 141 140 141  K 4.4 4.0 3.2* 5.3 4.6  CL 106 109 107 107 108  CO2 24 20 22 19 24   GLUCOSE 76 71 83 80 137*  BUN 25* 29* 23 23 20   CREATININE 0.75 0.97 0.97 0.94 0.87  CALCIUM 7.9* 7.5* 7.0* 7.1* 7.2*  Liver Function Tests:  Recent Labs Lab 09/16/13 1850 09/18/13 0040  AST 51* 58*  ALT 50* 44*  ALKPHOS 444* 437*  BILITOT 4.8* 4.3*  PROT 6.3 6.3  ALBUMIN 1.8* 1.7*   CBC:  Recent Labs Lab 09/16/13 1850 09/17/13 0750 09/18/13 0040 09/19/13 0051 09/20/13 0020  WBC 12.5* 9.7 9.4 6.1 3.9*  NEUTROABS 10.5*  --   --   --   --   HGB 13.5 13.3 13.4 13.1 13.6  HCT 38.4 37.9 37.3 37.3 38.5  MCV 94.8 96.9 94.2 94.9 96.3  PLT 92* 73* 93* 73* 71*   CBG:  Recent Labs Lab 09/19/13 1654 09/19/13 1747  GLUCAP 63* 157*    Recent Results (from the past 240 hour(s))  URINE CULTURE     Status: None   Collection Time    09/17/13  3:01 PM       Result Value Ref Range Status   Specimen Description URINE, CLEAN CATCH   Final   Special Requests NONE   Final   Culture  Setup Time     Final   Value: 09/17/2013 19:19     Performed at [facility] Count     Final   Value: NO GROWTH     Performed at [facility]   Culture     Final   Value: NO GROWTH     Performed at [facility]   Report Status 09/18/2013 FINAL   Final  MRSA PCR SCREENING     Status: Abnormal   Collection Time    09/17/13 10:12 PM      Result Value Ref Range Status   MRSA by PCR POSITIVE (*) NEGATIVE Final   Comment:            The GeneXpert MRSA Assay (FDA     approved for NASAL specimens     only), is one component of a     comprehensive MRSA colonization     surveillance program. It is not     intended to diagnose MRSA     infection nor to guide or     monitor treatment for     MRSA infections.     RESULT CALLED TO, READ BACK BY AND VERIFIED WITH:     BRALLEY,B RN AT 2334 07.22.15 BY TIBBITTS,K     Scheduled Meds: . chlorhexidine  15 mL Mouth/Throat QID  . Chlorhexidine Gluconate Cloth  6 each Topical Q0600  . dexamethasone  10 mg Intravenous 3 times per day  . enoxaparin (LOVENOX) injection  40 mg Subcutaneous Q24H  . feeding supplement (ENSURE COMPLETE)  237 mL Oral TID WC  . fluticasone  2 spray Each Nare Daily  . levothyroxine  50 mcg Oral QAC breakfast  . memantine  10 mg Oral BID  . multivitamin with minerals  1 tablet Oral Daily  . mupirocin ointment  1 application Nasal BID  . nystatin   Topical BID  . piperacillin-tazobactam (ZOSYN)  IV  3.375 g Intravenous 3 times per day  . sennosides  10 mL Oral QHS  . sodium chloride  3 mL Intravenous Q12H  . vancomycin  500 mg Intravenous Q24H  . vitamin B-12  1,000 mcg Oral QODAY   Continuous Infusions: . 0.9 % NaCl with KCl 20 mEq / L 50 mL/hr at 09/19/13 2211     Debbora Presto, MD  [facility] Pager [phone]  If 7PM-7AM, please contact  night-coverage [email or web address] Password [facility] 09/20/2013, 11:34 AM  LOS: 4 days

## 2013-09-20 NOTE — Progress Notes (Signed)
No change from am assessment.  Pt moaning quite frequently so IV morphine given and seems much more comfortable now.

## 2013-09-20 NOTE — Progress Notes (Signed)
PT Cancellation Note  Patient Details Name: Sheri Stone MRN: [id number] DOB: January 14, 1927   Cancelled Treatment:    Reason Eval/Treat Not Completed: PT screened, no needs identified, will sign off (Per discussion with RN as well as OT notes, pt does not follow commands, nonverbal only moans, hx of dementia, and total assist for care.  Pt resides at SNF and to return upon d/c.  Pt does not appear appropriate for acute PT at this time.  Will defer f/u to SNF.)   Tamala SerUhlenberg, Ajene Carchi Kistler [phone] 09/20/2013, 12:29 PM

## 2013-09-21 LAB — CBC
HCT: 38.5 % (ref 36.0–46.0)
Hemoglobin: 13.9 g/dL (ref 12.0–15.0)
MCH: 34.8 pg — ABNORMAL HIGH (ref 26.0–34.0)
MCHC: 36.1 g/dL — ABNORMAL HIGH (ref 30.0–36.0)
MCV: 96.3 fL (ref 78.0–100.0)
Platelets: 78 10*3/uL — ABNORMAL LOW (ref 150–400)
RBC: 4 MIL/uL (ref 3.87–5.11)
RDW: 18.1 % — AB (ref 11.5–15.5)
WBC: 8.3 10*3/uL (ref 4.0–10.5)

## 2013-09-21 LAB — BASIC METABOLIC PANEL
Anion gap: 14 (ref 5–15)
BUN: 34 mg/dL — ABNORMAL HIGH (ref 6–23)
CHLORIDE: 110 meq/L (ref 96–112)
CO2: 21 meq/L (ref 19–32)
Calcium: 7.9 mg/dL — ABNORMAL LOW (ref 8.4–10.5)
Creatinine, Ser: 1.18 mg/dL — ABNORMAL HIGH (ref 0.50–1.10)
GFR calc non Af Amer: 41 mL/min — ABNORMAL LOW (ref >90)
GFR, EST AFRICAN AMERICAN: 47 mL/min — AB (ref >90)
Glucose, Bld: 131 mg/dL — ABNORMAL HIGH (ref 70–99)
Potassium: 4 mEq/L (ref 3.7–5.3)
SODIUM: 145 meq/L (ref 137–147)

## 2013-09-21 MED ORDER — ENOXAPARIN SODIUM 30 MG/0.3ML ~~LOC~~ SOLN
30.0000 mg | SUBCUTANEOUS | Status: DC
  Administered 2013-09-22 – 2013-09-23 (×2): 30 mg via SUBCUTANEOUS
  Filled 2013-09-21 (×2): qty 0.3

## 2013-09-21 NOTE — Progress Notes (Signed)
Patient ID: Sheri Stone, female   DOB: [birth year], 80 y.o.   MRN: [id number]  [facility] PROGRESS NOTE  ONEY TATLOCK ION:[id number] DOB: [birth year] DOA: 09/16/2013 PCP: Pcp Not In System  Brief narrative:  80 yr old woman who was sent from the nursing facility for an increasing submandibular mass. Pt has dementia at baseline and was unable to provide nay history. Non verbal on admission.   Active Problems:  Abscess of submandibular gland  - completed Clindamycin for 4 days  - escalate ABX coverage to Vanc and Zosyn July 24th, 2015 so today is the day #3 of these ABX - source likely from poor dentition  - no significant clinical improvement on physical exam, glands still swollen and pt with very poor oral intake - will continue our GOC discussions with family give pt's very slow recovery and progressive FTT - appreciated ENT input  UTI (urinary tract infection)  - urine culture negative  - stop rocephin July 24th, 2015  Vulvar cellulitis  - current abx should be adequate  - provide barrier cream  - keep foley for now  Functional quadriplegia - GOC discussion given significant FTT  Acute renal failure - likely from poor oral intake an d? vanc side effect - close monitoring while on vancomycin  - repeat BMP in AM Dementia, advanced  - will likely go to SNF upon discharge  - PCT consult for goals of care discussion  - will continue GOC discussions, family still insisting of full code - pt is deteriorating with progressive FTT, unable to eat  Protein-calorie malnutrition, severe  - SLP evaluation and advancing diet as pt able to tolerate  - pt still minimally cooperative and unable to feed herself, requires assistance with ADL  - PCT consult appreciated  Leukocytosis  - secondary to abscess as noted above  - ABX as noted above, WBC WNL this AM  - repeat CBC in AM  Thrombocytopenia  - stable overall with no signs of active bleeding  Transaminitis  - stable overall   DVT prophylaxis  - Lovenox SQ   Consultants:  None  Procedures/Studies:  Ct Soft Tissue Neck W Contrast 09/16/2013 Marked enlargement of the submandibular gland bilaterally with increased enhancement and surrounding edema suggesting acute infection. Multiple small fluid collections are present in both submandibular glands suggesting multiple micro abscesses. These all measure under 1 cm.  Antibiotics:  Clindamycin 7/21 --> 7/24  Rocephin 7/22 --> 7/24  Vancomycin 7/24 -->  Zosyn 7/24 -->  Code Status: Full  Family Communication: No family at bedside this AM, left message for daughter over the phone and gave my personal cell phone to call me back  Disposition Plan: SNF when ready   HPI/Subjective: No events overnight.   Objective: Filed Vitals:   09/20/13 0558 09/20/13 1500 09/20/13 2212 09/21/13 0446  BP: 145/83 138/89 152/95 144/87  Pulse: 98 112 118 110  Temp: 98.2 F (36.8 C) 97.8 F (36.6 C) 98 F (36.7 C) 97.3 F (36.3 C)  TempSrc: Axillary Axillary Oral Axillary  Resp: 20 22 18 20   Height:      Weight:      SpO2:  93% 96% 93%    Intake/Output Summary (Last 24 hours) at 09/21/13 0731 Last data filed at 09/21/13 0448  Gross per 24 hour  Intake    120 ml  Output    400 ml  Net   -280 ml    Exam:   General:  Pt is somnolent and confused,  non verbal and not following any commands  Cardiovascular: Regular rhythm, tachycardic, no rubs, no gallops  Respiratory: Clear to auscultation bilaterally, diminished breath sounds at bases   Abdomen: Soft, non tender, non distended, bowel sounds present, no guarding  HEENT: Bilateral submandibular glans swollen and notably tender with palpation   Data Reviewed: Basic Metabolic Panel:  Recent Labs Lab 09/18/13 0040 09/19/13 0051 09/19/13 0800 09/20/13 0020 09/21/13 0500  NA 142 141 140 141 145  K 4.0 3.2* 5.3 4.6 4.0  CL 109 107 107 108 110  CO2 20 22 19 24 21   GLUCOSE 71 83 80 137* 131*  BUN 29* 23 23 20   34*  CREATININE 0.97 0.97 0.94 0.87 1.18*  CALCIUM 7.5* 7.0* 7.1* 7.2* 7.9*   Liver Function Tests:  Recent Labs Lab 09/16/13 1850 09/18/13 0040  AST 51* 58*  ALT 50* 44*  ALKPHOS 444* 437*  BILITOT 4.8* 4.3*  PROT 6.3 6.3  ALBUMIN 1.8* 1.7*   CBC:  Recent Labs Lab 09/16/13 1850 09/17/13 0750 09/18/13 0040 09/19/13 0051 09/20/13 0020 09/21/13 0500  WBC 12.5* 9.7 9.4 6.1 3.9* 8.3  NEUTROABS 10.5*  --   --   --   --   --   HGB 13.5 13.3 13.4 13.1 13.6 13.9  HCT 38.4 37.9 37.3 37.3 38.5 38.5  MCV 94.8 96.9 94.2 94.9 96.3 96.3  PLT 92* 73* 93* 73* 71* 78*   CBG:  Recent Labs Lab 09/19/13 1654 09/19/13 1747  GLUCAP 63* 157*    Recent Results (from the past 240 hour(s))  URINE CULTURE     Status: None   Collection Time    09/17/13  3:01 PM      Result Value Ref Range Status   Specimen Description URINE, CLEAN CATCH   Final   Special Requests NONE   Final   Culture  Setup Time     Final   Value: 09/17/2013 19:19     Performed at [facility]   Colony Count     Final   Value: NO GROWTH     Performed at [facility]   Culture     Final   Value: NO GROWTH     Performed at [facility]   Report Status 09/18/2013 FINAL   Final  MRSA PCR SCREENING     Status: Abnormal   Collection Time    09/17/13 10:12 PM      Result Value Ref Range Status   MRSA by PCR POSITIVE (*) NEGATIVE Final   Comment:            The GeneXpert MRSA Assay (FDA     approved for NASAL specimens     only), is one component of a     comprehensive MRSA colonization     surveillance program. It is not     intended to diagnose MRSA     infection nor to guide or     monitor treatment for     MRSA infections.     RESULT CALLED TO, READ BACK BY AND VERIFIED WITH:     BRALLEY,B RN AT 2334 07.22.15 BY TIBBITTS,K     Scheduled Meds: . chlorhexidine  15 mL Mouth/Throat QID  . Chlorhexidine Gluconate Cloth  6 each Topical Q0600  . enoxaparin (LOVENOX) injection  40  mg Subcutaneous Q24H  . feeding supplement (ENSURE COMPLETE)  237 mL Oral TID WC  . fluticasone  2 spray Each Nare Daily  . levothyroxine  50 mcg Oral QAC breakfast  . memantine  10 mg Oral BID  . multivitamin with minerals  1 tablet Oral Daily  . mupirocin ointment  1 application Nasal BID  . nystatin   Topical BID  . piperacillin-tazobactam (ZOSYN)  IV  3.375 g Intravenous 3 times per day  . sennosides  10 mL Oral QHS  . sodium chloride  3 mL Intravenous Q12H  . vancomycin  500 mg Intravenous Q24H  . vitamin B-12  1,000 mcg Oral QODAY   Continuous Infusions: . 0.9 % NaCl with KCl 20 mEq / L 30 mL/hr at 09/20/13 2227     Debbora Presto, MD  [facility] Pager [phone]  If 7PM-7AM, please contact night-coverage [email or web address] Password [facility] 09/21/2013, 7:31 AM   LOS: 5 days

## 2013-09-21 NOTE — Progress Notes (Signed)
Patient MW:UXLK:Sheri Stone      DOB: 07-20-1926      GMW:[id number]  Daughter at bedside this morning.  Feels like mom is "doing better" as swelling down and she is communicating more "calling out, moaning".  Talked about come concerns related to her renal function and difficulty taking in pills, oral intake.  Will follow-up with family again tomorrow.  Orvis BrillAaron J. Kedar Sedano D.O. Palliative Medicine Team at [facility]  Pager: [phone] Team Phone: [phone]

## 2013-09-21 NOTE — Progress Notes (Signed)
Spoke with Pt's son, Molly MaduroRobert, re: Pt's financial situation.  Pt's son stated that he does have durable POA but that the Social Security office will not recognize a durable POA.  They require a letter from an MD stating that Pt does not have capacity to make decisions and, once this letter is received, they will allow the durable POA, Pt's son, to make the decisions re: her Social Security check.  CSW to pass this info along to attending MD so that she may discuss the situation with Pt's family.  CSW thanked Pt's son for his time.  Providence CrosbyAmanda Milos Milligan, LCSW Clinical Social Work [phone]

## 2013-09-22 DIAGNOSIS — K113 Abscess of salivary gland: Principal | ICD-10-CM

## 2013-09-22 DIAGNOSIS — E860 Dehydration: Secondary | ICD-10-CM

## 2013-09-22 LAB — BASIC METABOLIC PANEL
Anion gap: 11 (ref 5–15)
BUN: 39 mg/dL — ABNORMAL HIGH (ref 6–23)
CALCIUM: 8 mg/dL — AB (ref 8.4–10.5)
CHLORIDE: 116 meq/L — AB (ref 96–112)
CO2: 23 meq/L (ref 19–32)
CREATININE: 1.17 mg/dL — AB (ref 0.50–1.10)
GFR calc non Af Amer: 41 mL/min — ABNORMAL LOW (ref >90)
GFR, EST AFRICAN AMERICAN: 48 mL/min — AB (ref >90)
Glucose, Bld: 107 mg/dL — ABNORMAL HIGH (ref 70–99)
Potassium: 3.7 mEq/L (ref 3.7–5.3)
SODIUM: 150 meq/L — AB (ref 137–147)

## 2013-09-22 LAB — VANCOMYCIN, TROUGH: VANCOMYCIN TR: 5.9 ug/mL — AB (ref 10.0–20.0)

## 2013-09-22 MED ORDER — VANCOMYCIN HCL IN DEXTROSE 750-5 MG/150ML-% IV SOLN
750.0000 mg | INTRAVENOUS | Status: DC
  Administered 2013-09-23: 750 mg via INTRAVENOUS
  Filled 2013-09-22: qty 150

## 2013-09-22 MED ORDER — DEXTROSE 5 % IV SOLN
INTRAVENOUS | Status: DC
  Administered 2013-09-22: 50 mL via INTRAVENOUS
  Administered 2013-09-23: 04:00:00 via INTRAVENOUS

## 2013-09-22 MED ORDER — HALOPERIDOL LACTATE 5 MG/ML IJ SOLN
2.0000 mg | Freq: Four times a day (QID) | INTRAMUSCULAR | Status: DC
  Administered 2013-09-22 – 2013-09-23 (×2): 2 mg via INTRAVENOUS
  Filled 2013-09-22 (×3): qty 0.4
  Filled 2013-09-22: qty 1
  Filled 2013-09-22: qty 0.4
  Filled 2013-09-22: qty 1

## 2013-09-22 NOTE — Progress Notes (Signed)
Patient AV:Sheri Stone      DOB: [birth year]      JXB:[id number]   Palliative Medicine Team at [facility] Progress Note    Subjective: Moaning out.  Occaisionally can make out 1-2 words, but no coherent conversation. Still unable to take pills or oral nutrition.      Filed Vitals:   09/22/13 0351  BP: 127/75  Pulse: 97  Temp: 97.6 F (36.4 C)  Resp: 20   Physical exam: GEN: Confused, appears distressed HEENT: [place], mmm 2/2 excellent oral care by nursing CV: RRR LUNGS: scattered coarse breath sounds EXT: lower ext edema SKIN: warm/dry   CBC    Component Value Date/Time   WBC 8.3 09/21/2013 0500   RBC 4.00 09/21/2013 0500   HGB 13.9 09/21/2013 0500   HCT 38.5 09/21/2013 0500   PLT 78* 09/21/2013 0500   MCV 96.3 09/21/2013 0500   MCH 34.8* 09/21/2013 0500   MCHC 36.1* 09/21/2013 0500   RDW 18.1* 09/21/2013 0500   LYMPHSABS 1.5 09/16/2013 1850   MONOABS 0.5 09/16/2013 1850   EOSABS 0.0 09/16/2013 1850   BASOSABS 0.0 09/16/2013 1850    CMP     Component Value Date/Time   NA 150* 09/22/2013 0032   K 3.7 09/22/2013 0032   CL 116* 09/22/2013 0032   CO2 23 09/22/2013 0032   GLUCOSE 107* 09/22/2013 0032   BUN 39* 09/22/2013 0032   CREATININE 1.17* 09/22/2013 0032   CALCIUM 8.0* 09/22/2013 0032   PROT 6.3 09/18/2013 0040   ALBUMIN 1.7* 09/18/2013 0040   AST 58* 09/18/2013 0040   ALT 44* 09/18/2013 0040   ALKPHOS 437* 09/18/2013 0040   BILITOT 4.3* 09/18/2013 0040   GFRNONAA 41* 09/22/2013 0032   GFRAA 48* 09/22/2013 0032         Assessment and plan:  80 yo female with advanced dementia presenting with submandibular abscess, UTI, encephalopathy  1. Code Status:  DNR, discussed with both son Sheri Stone) and dghtr Sheri Stone who is at bedside.  OO[facility] DNR signed and in paper chart.    2.Goals of Care: See initial consult note. And follow-up conversation over weekend.  While they can visabilly see that her submandibular abscess have improved with abx/steroids, Sheri Stone is still doing poorly.  She is moaning out and family acknoweldges worry about suffering. She is still unable to swallow any pills or take oral nutrition.  They are concerned about how she could ever be cared for at nursing home. Discussed that I too was hopeful that mentally she would have returned more towards baseline as infection improved.  Discussed role of feeding tubes in dementia and that it would not provide long-term solution as feeding tubes do not provide comfort, prolonged survival, or decreased risk of aspiration in advanced dementia like Sheri Stone's. Also discussed concern that putting in a short term nasal feeding tube would likely make her more uncomfortable (already having trouble), and I am concerned it would not address the underlying issue of her advanced dementia which has been worsening since her fall with pelvic fx 2 months ago. We talked about hospice as a way to provide as much comfort and quality to her life with time she has remaining.  Both Sheri Stone and [place] agreed with this.  We discussed switching focus to comfort by discontinuing oral medications here, working more aggressively on comfort medications, and continuing abx and slow IVF until hospice can be arranged. Plan will be to d/c abx, fluids when d/c to hospice facility.  Expect  prognosis is days to week and would make an excellent residential hospice candidate for ongoing management of difficult to control pain management.   3. Symptom Management:  1. Anxiety/Agitation/Delirium: Continue haldol PRN. Will schedule haldol 2mg  q6h.  2. Pain: PRN morphine x3. Continue. May need to consider scheduling.  3. Bowel Regimen: not able to take oral meds.  PRN suppository 4. Dysphagia- Family agrees we should not place short or long term feeding tube.  5.  4. Psychosocial: [facility] prior to this admission. Before that was at [facility]. Daughter/Son are main caregivers.    Time In: 1100 Time Out: 1140 Total time: 40 min    Greater than 50% of this time was spent counseling and coordinating care related to the above assessment and plan.   Orvis BrillAaron J. Daylyn Christine D.O.  Palliative Medicine Team at [facility]  Pager: [phone]  Team Phone: [phone]

## 2013-09-22 NOTE — Progress Notes (Addendum)
ANTIBIOTIC CONSULT NOTE - follow-up  Pharmacy Consult for vancomcyin/zosyn Indication: cellulitis/abscess  No Known Allergies  Patient Measurements: Height: 5\' 2"  (157.5 cm) (estimate, pt. nonverbal) Weight: 122 lb 12.7 oz (55.7 kg) IBW/kg (Calculated) : 50.1 Adjusted Body Weight:   Vital Signs: Temp: 97.6 F (36.4 C) (07/27 0351) Temp src: Oral (07/27 0351) BP: 127/75 mmHg (07/27 0351) Pulse Rate: 97 (07/27 0351) Intake/Output from previous day: 07/26 0701 - 07/27 0700 In: 1070 [I.V.:720; IV Piggyback:350] Out: 575 [Urine:575] Intake/Output from this shift:    Labs:  Recent Labs  09/20/13 0020 09/21/13 0500 09/22/13 0032  WBC 3.9* 8.3  --   HGB 13.6 13.9  --   PLT 71* 78*  --   CREATININE 0.87 1.18* 1.17*   Estimated Creatinine Clearance: 27.8 ml/min (by C-G formula based on Cr of 1.17).  Recent Labs  09/22/13 0904  VANCOTROUGH 5.9*     Microbiology: Recent Results (from the past 720 hour(s))  URINE CULTURE     Status: None   Collection Time    09/17/13  3:01 PM      Result Value Ref Range Status   Specimen Description URINE, CLEAN CATCH   Final   Special Requests NONE   Final   Culture  Setup Time     Final   Value: 09/17/2013 19:19     Performed at [facility]   Colony Count     Final   Value: NO GROWTH     Performed at [facility]   Culture     Final   Value: NO GROWTH     Performed at [facility]   Report Status 09/18/2013 FINAL   Final  MRSA PCR SCREENING     Status: Abnormal   Collection Time    09/17/13 10:12 PM      Result Value Ref Range Status   MRSA by PCR POSITIVE (*) NEGATIVE Final   Comment:            The GeneXpert MRSA Assay (FDA     approved for NASAL specimens     only), is one component of a     comprehensive MRSA colonization     surveillance program. It is not     intended to diagnose MRSA     infection nor to guide or     monitor treatment for     MRSA infections.     RESULT CALLED TO,  READ BACK BY AND VERIFIED WITH:     BRALLEY,B RN AT 2334 07.22.15 BY TIBBITTS,K    Medical History: Past Medical History  Diagnosis Date  . Alzheimer's dementia   . UTI (urinary tract infection)   . Thyroid disease   . Shoulder dislocation approx. 2013    left  . Pubic bone fracture approx April 2015    Assessment: [age] YOF with abscess of submandibular gland and vulvar abscess, on clindamycin since 7/22. Orders to start vancomycin and zosyn.   7/22 >> clindamycin  >> 7/24 7/24 >> vancomycin >> 7/24  >>  Zosyn >>  Tmax: afeb WBCs: 8.9 Renal: Scr = 1.18, normalized CrCL = 1927ml/min  7/22 urine: NG   7/27 0900 VT = 5.9 (before 4th total dose) SUBtherapeutic  Goal of Therapy:  Vancomycin trough level 10-15 mcg/ml  Plan:   Increase Vancomycin to 750mg  IV q24h  Check vancomycin trough at steady state  Zosyn 3.375gm IV q8h over 4h infusion  Loma BostonLaura Loring Liskey, PharmD Pager: [phone] 09/22/2013 10:06 AM

## 2013-09-22 NOTE — Progress Notes (Signed)
CSW [facility] from [facility] that patient is now for Residential Hospice Facility. CSW spoke with patient's son, Molly MaduroRobert & daughter, Elnita MaxwellCheryl via phone - reviewed list of hospice facilities. Daughter states that [facility] would be most convenient for their family as [place].   CSW called Jacklynn Ganongiana Stephens, [facility] liaison with referral - awaiting call back re: bed availability/eligbility.   Lincoln MaxinKelly Breezie Micucci, LCSW [facility] Clinical Social Worker cell #: [phone]

## 2013-09-22 NOTE — Progress Notes (Signed)
SLP Cancellation Note  Patient Details Name: Charlann LangeMary E Makar MRN: [id number] DOB: [birth year]   Cancelled treatment:       Reason Eval/Treat Not Completed: Other (comment) (pt for residential hospice per RN report, pt not consuming po intake, slp to sign off, please reorder if desire)   Mills KollerKimball, Tressa Maldonado Ann Darielle Hancher, MS [facility] SLP [phone]

## 2013-09-22 NOTE — Progress Notes (Addendum)
[facility] PROGRESS NOTE  Sheri Stone ZOX:[id number] DOB: [birth year] DOA: 09/16/2013 PCP: Pcp Not In System  Assessment/Plan: Submandibular gland abscess Started On empiric clindamycin which she received for 4 days without much improvement and antibiotic was escalated to vancomycin and Zosyn ( day 4 of 7). Patient remains afebrile. She still has swollen glands and has poor by mouth intake with failure to thrive. Source of infection likely from her poor dentition.  UTI Received Rocephin until 7/24. Cultures negative   Vulvar cellulitis On empiric antibiotics which would be a little coarse. Behavior cream applied. Continue Foley catheter  Severe dementia with significant failure to thrive Patient is somnolent, nonverbal and nonambulatory. She has significant functional decline. Palliative care consulted for goals of care. Patient's son and daughter are her health care proxy and she wanted aggressive measures for her care. But given her functional decline he now wished for comfort and wished to continue antibiotics until course is completed. Given her functional decline there are inclined towards sitting to hospice. Patient will continue antibiotics and fluids until she is ready  to be discharged to hospice. Social work has been consulted and referral sent out to hospice all [place] as per family preference. -Patient placed on when necessary morphine for pain and comfort and scheduled Haldol 2 mg every 6 hours for anxiety and agitation. Family are not keen on any short or long-term feeding tube for dysphagia.  Hyponatremia Secondary to dehydration Placed on D5 water.  acute kidney injury Secondary to dehydration   Code Status: DO NOT RESUSCITATE with comfort care Family Communication: Called Son Sheri Maduro and updated Disposition Plan: residential hospice   Consultants:  Palliative care  Procedures:  None  Antibiotics:  IV vancomycin and Zosyn (Day  4/7)  HPI/Subjective: Patient continues to be somnolent and poorly responsive  Objective: Filed Vitals:   09/22/13 1300  BP: 137/70  Pulse: 96  Temp: 98 F (36.7 C)  Resp: 18    Intake/Output Summary (Last 24 hours) at 09/22/13 1550 Last data filed at 09/22/13 1300  Gross per 24 hour  Intake    870 ml  Output    775 ml  Net     95 ml   Filed Weights   09/17/13 0027  Weight: 55.7 kg (122 lb 12.7 oz)    Exam:   General:  Elderly female lying in bed poorly responsive to commands, somnolent  HEENT:: Swollen Submandibular gland with morning on pressure  Cardiovascular: Normal S1 and S2, no murmurs  Respiratory: Good auscultation bilaterally, no added sounds  Abdomen: Soft, nondistended, nontender, bowel sounds present, present  Extremities: Warm, no edema  CNS:  somnolent and poorly responsive   Data Reviewed: Basic Metabolic Panel:  Recent Labs Lab 09/19/13 0051 09/19/13 0800 09/20/13 0020 09/21/13 0500 09/22/13 0032  NA 141 140 141 145 150*  K 3.2* 5.3 4.6 4.0 3.7  CL 107 107 108 110 116*  CO2 22 19 24 21 23   GLUCOSE 83 80 137* 131* 107*  BUN 23 23 20  34* 39*  CREATININE 0.97 0.94 0.87 1.18* 1.17*  CALCIUM 7.0* 7.1* 7.2* 7.9* 8.0*   Liver Function Tests:  Recent Labs Lab 09/16/13 1850 09/18/13 0040  AST 51* 58*  ALT 50* 44*  ALKPHOS 444* 437*  BILITOT 4.8* 4.3*  PROT 6.3 6.3  ALBUMIN 1.8* 1.7*   No results found for this basename: LIPASE, AMYLASE,  in the last 168 hours No results found for this basename: AMMONIA,  in the last 168 hours  CBC:  Recent Labs Lab 09/16/13 1850 09/17/13 0750 09/18/13 0040 09/19/13 0051 09/20/13 0020 09/21/13 0500  WBC 12.5* 9.7 9.4 6.1 3.9* 8.3  NEUTROABS 10.5*  --   --   --   --   --   HGB 13.5 13.3 13.4 13.1 13.6 13.9  HCT 38.4 37.9 37.3 37.3 38.5 38.5  MCV 94.8 96.9 94.2 94.9 96.3 96.3  PLT 92* 73* 93* 73* 71* 78*   Cardiac Enzymes: No results found for this basename: CKTOTAL, CKMB, CKMBINDEX,  TROPONINI,  in the last 168 hours BNP (last 3 results) No results found for this basename: PROBNP,  in the last 8760 hours CBG:  Recent Labs Lab 09/19/13 1654 09/19/13 1747  GLUCAP 63* 157*    Recent Results (from the past 240 hour(s))  URINE CULTURE     Status: None   Collection Time    09/17/13  3:01 PM      Result Value Ref Range Status   Specimen Description URINE, CLEAN CATCH   Final   Special Requests NONE   Final   Culture  Setup Time     Final   Value: 09/17/2013 19:19     Performed at [facility]   Colony Count     Final   Value: NO GROWTH     Performed at [facility]   Culture     Final   Value: NO GROWTH     Performed at [facility]   Report Status 09/18/2013 FINAL   Final  MRSA PCR SCREENING     Status: Abnormal   Collection Time    09/17/13 10:12 PM      Result Value Ref Range Status   MRSA by PCR POSITIVE (*) NEGATIVE Final   Comment:            The GeneXpert MRSA Assay (FDA     approved for NASAL specimens     only), is one component of a     comprehensive MRSA colonization     surveillance program. It is not     intended to diagnose MRSA     infection nor to guide or     monitor treatment for     MRSA infections.     RESULT CALLED TO, READ BACK BY AND VERIFIED WITH:     BRALLEY,B RN AT 2334 07.22.15 BY TIBBITTS,K     Studies: No results found.  Scheduled Meds: . chlorhexidine  15 mL Mouth/Throat QID  . Chlorhexidine Gluconate Cloth  6 each Topical Q0600  . enoxaparin (LOVENOX) injection  30 mg Subcutaneous Q24H  . fluticasone  2 spray Each Nare Daily  . haloperidol lactate  2 mg Intravenous 4 times per day  . multivitamin with minerals  1 tablet Oral Daily  . mupirocin ointment  1 application Nasal BID  . nystatin   Topical BID  . piperacillin-tazobactam (ZOSYN)  IV  3.375 g Intravenous 3 times per day  . sodium chloride  3 mL Intravenous Q12H  . [START ON 09/23/2013] vancomycin  750 mg Intravenous Q24H    Continuous Infusions: . dextrose 50 mL (09/22/13 0755)      Time spent: 25 MNUTES    Sheri Stone  [facility] Pager [phone]. If 7PM-7AM, please contact night-coverage at [email or web address], password [email or web address] 09/22/2013, 3:50 PM  LOS: 6 days

## 2013-09-22 NOTE — Care Management Note (Signed)
    Page 1 of 1   09/22/2013     2:08:17 PM CARE MANAGEMENT NOTE 09/22/2013  Patient:  Sheri Stone,Sheri Stone   Account Number:  [id number]  Date Initiated:  09/18/2013  Documentation initiated by:  Ezekiel InaMcGIBBONEY,COOKIE  Subjective/Objective Assessment:   Pt admitted from SNF with increasing Abscess of submandibular gland and UTI     Action/Plan:   from SNF   Anticipated DC Date:  09/23/2013   [facility]  In-house referral  Clinical Social Worker      DC Planning Services  CM consult      Choice offered to / List presented to:             Status of service:  In process, will continue to follow Medicare Important Message given?  YES (If response is "NO", the following Medicare IM given date fields will be blank) Date Medicare IM given:  09/19/2013 Medicare IM given by:  Ezekiel InaMcGIBBONEY,COOKIE Date Additional Medicare IM given:  09/22/2013 Additional Medicare IM given by:  [facility] Jorita Bohanon  Discharge Disposition:    Per UR Regulation:  Reviewed for med. necessity/level of care/duration of stay  If discussed at Long Length of Stay Meetings, dates discussed:    Comments:  09/20/13 [facility] Paddy Walthall RN,BSN NCM [phone] SPOKE TO SON BOBBY-C#[phone] ABOUT D/C PLANS-INFORMED SON OF MEDICARE IM NOTICE,NOW DNR,RESIDENTIAL HOSPICE FACILITY LIKELY IN AM.SW FOLLOWING.  09/18/13 MMcGibboney, RN, BSN chart reviewed.

## 2013-09-23 DIAGNOSIS — E860 Dehydration: Secondary | ICD-10-CM | POA: Diagnosis present

## 2013-09-23 DIAGNOSIS — G9340 Encephalopathy, unspecified: Secondary | ICD-10-CM

## 2013-09-23 DIAGNOSIS — R627 Adult failure to thrive: Secondary | ICD-10-CM

## 2013-09-23 DIAGNOSIS — N179 Acute kidney failure, unspecified: Secondary | ICD-10-CM

## 2013-09-23 LAB — CREATININE, SERUM
Creatinine, Ser: 1.12 mg/dL — ABNORMAL HIGH (ref 0.50–1.10)
GFR, EST AFRICAN AMERICAN: 50 mL/min — AB (ref >90)
GFR, EST NON AFRICAN AMERICAN: 44 mL/min — AB (ref >90)

## 2013-09-23 MED ORDER — HYDROMORPHONE HCL PF 1 MG/ML IJ SOLN
0.5000 mg | INTRAMUSCULAR | Status: DC | PRN
  Administered 2013-09-23: 0.5 mg via INTRAVENOUS
  Filled 2013-09-23: qty 1

## 2013-09-23 MED ORDER — LORAZEPAM 0.5 MG PO TABS: 0.5000 mg | ORAL_TABLET | Freq: Three times a day (TID) | ORAL | Status: AC | PRN

## 2013-09-23 MED ORDER — BISACODYL 10 MG RE SUPP: 10.0000 mg | Freq: Every day | RECTAL | Status: AC | PRN

## 2013-09-23 MED ORDER — HYDROMORPHONE HCL PF 1 MG/ML IJ SOLN
0.5000 mg | Freq: Four times a day (QID) | INTRAMUSCULAR | Status: DC
  Administered 2013-09-23: 0.5 mg via INTRAVENOUS
  Filled 2013-09-23: qty 1

## 2013-09-23 MED ORDER — OXYCODONE HCL 20 MG/ML PO CONC: 6.0000 mg | ORAL | Status: AC | PRN

## 2013-09-23 NOTE — Discharge Summary (Signed)
Physician Discharge Summary  Sheri Stone ZOX:[id number] DOB: 25-Apr-1926 DOA: 09/16/2013  PCP: Pcp Not In System  Admit date: 09/16/2013 Discharge date: 09/23/2013  Time spent: 35 minutes  Recommendations for Outpatient Follow-up:  1. Discharge to residential hospice for full comfort  Discharge Diagnoses:  Principal Problem:   Abscess of submandibular gland  Active Problems:   UTI (urinary tract infection)   Vulvar cellulitis   Vulvovaginal candidiasis   Dementia   Protein-calorie malnutrition, severe   Encephalopathy acute   Dehydration   Acute kidney injury   Failure to thrive in adult   Discharge Condition: Guarded  Diet recommendation: Comfort feeding he is awake  Filed Weights   09/17/13 0027  Weight: 55.7 kg (122 lb 12.7 oz)    History of present illness:  80 year old female we've Alzheimer's dementia , resident of skilled nursing facility presented to the ED with pain and submandibular mass. Patient was poorly communicative due to severe dementia. Patient found to have a submandibular gland abscess and started on IV clindamycin with minimal response and antibiotic escalated. Patient encephalopathic with significant failure to thrive palliative care consult for goals of care was requested.  Hospital Course:  Submandibular gland abscess  Started On empiric clindamycin which she received for 4 days without much improvement and antibiotic was escalated to vancomycin and Zosyn ( day 5 ). Patient remains afebrile. She still has swollen glands and unable to take anything by mouth with encephalopathy and failure to thrive. Source of infection likely from her poor dentition.  Discontinued antibiotics upon discharge as patient being discharged to hospice for full comfort.  UTI  Received Rocephin until 7/24. Cultures negative   Vulvar cellulitis  On empiric antibiotics which would cover adequately. Local cream applied. Continue Foley catheter upon discharge  Severe  dementia with significant failure to thrive  Patient is somnolent, nonverbal and nonambulatory.  She moans and  occasionally opens her eyes . She has significant functional decline. Palliative care consulted for goals of care. Patient's son and daughter are her health care proxy and they wanted aggressive measures for her care initially but  given her functional decline they subsequently wished for comfort .  and have placed him to his into hospice. Patient placed on IV morphine as needed for pain and comfort and IV Haldol as needed for agitation with good response. Patient will be discharged on oral roxanol when necessary   for pain and dyspnea and as needed  low dose Ativan for anxiety .  Antibiotic discontinued upon discharge. [facility]   Hyponatremia  Secondary to dehydration  Placed on D5 water while in the hospital   acute kidney injury  Secondary to dehydration   Code Status: DO NOT RESUSCITATE with full comfort  Family Communication: Called Son [facility] and updated  on 7/28. Spoke with Dr. Talbert Forest at bedside  Disposition Plan: residential hospice   Consultants:  Palliative care Procedures:  None Antibiotics:  IV vancomycin and Zosyn    Discharge Exam: Filed Vitals:   09/23/13 0414  BP: 138/80  Pulse: 95  Temp: 97.3 F (36.3 C)  Resp: 18   General: Elderly female lying in bed poorly responsive to commands, somnolent  HEENT:: Swollen Submandibular gland with morning on pressure  Cardiovascular: Normal S1 and S2, no murmurs  Respiratory: Good auscultation bilaterally, no added sounds  Abdomen: Soft, nondistended, nontender, bowel sounds present,  Extremities: Warm, no edema  CNS: somnolent and poorly responsive    Discharge  Instructions You were cared for by a hospitalist during your hospital stay. If you have any questions about your discharge medications or the care you received while you were in the hospital after you  are discharged, you can call the unit and asked to speak with the hospitalist on call if the hospitalist that took care of you is not available. Once you are discharged, your primary care physician will handle any further medical issues. Please note that NO REFILLS for any discharge medications will be authorized once you are discharged, as it is imperative that you return to your primary care physician (or establish a relationship with a primary care physician if you do not have one) for your aftercare needs so that they can reassess your need for medications and monitor your lab values.     Medication List    STOP taking these medications       acetaminophen 500 MG tablet  Commonly known as:  TYLENOL     CERTAVITE SENIOR/ANTIOXIDANT Tabs     fluticasone 50 MCG/ACT nasal spray  Commonly known as:  FLONASE     HYDROcodone-acetaminophen 5-325 MG per tablet  Commonly known as:  NORCO     levothyroxine 50 MCG tablet  Commonly known as:  SYNTHROID, LEVOTHROID     memantine 10 MG tablet  Commonly known as:  NAMENDA     polyethylene glycol packet  Commonly known as:  MIRALAX / GLYCOLAX     vitamin B-12 1000 MCG tablet  Commonly known as:  CYANOCOBALAMIN     zolpidem 5 MG tablet  Commonly known as:  AMBIEN      TAKE these medications       bisacodyl 10 MG suppository  Commonly known as:  DULCOLAX  Place 1 suppository (10 mg total) rectally daily as needed for mild constipation or moderate constipation.     LORazepam 0.5 MG tablet  Commonly known as:  ATIVAN  Take 1 tablet (0.5 mg total) by mouth every 8 (eight) hours as needed for anxiety.     oxyCODONE 20 MG/ML concentrated solution  Commonly known as:  ROXICODONE INTENSOL  Take 0.3 mLs (6 mg total) by mouth every 4 (four) hours as needed for moderate pain or severe pain.       No Known Allergies     Follow-up Information   Please follow up. (follow up at residential hospice)        The results of significant  diagnostics from this hospitalization (including imaging, microbiology, ancillary and laboratory) are listed below for reference.    Significant Diagnostic Studies: Ct Soft Tissue Neck W Contrast  09/19/2013   CLINICAL DATA:  Clinical lymphadenopathy, throat swelling, and elevated white blood cell count. Will  EXAM: CT NECK WITH CONTRAST  TECHNIQUE: Multidetector CT imaging of the neck was performed using the standard protocol following the bolus administration of intravenous contrast.  CONTRAST:  OMNIPAQUE IOHEXOL 300 MG/ML  SOLN  COMPARISON:  Neck CT scan dated September 16, 2013  FINDINGS: Again demonstrated is marked enlargement of the submandibular glands greater on the right than on the left. The right submandibular gland measures 2.5 cm transversely by 3.8 cm AP. The left submandibular gland measures 2 cm transversely by 3.1 cm AP. The enhancement pattern is consistent with progressive development of multiple abscesses some which now measure over 1 cm in diameter. There is no cervical lymphadenopathy.  The parotid glands are unremarkable. The jugular and carotid vessels are normal. The structures of the tongue  base are normal. The larynx and thyroid gland are unremarkable. There is degenerative disc and facet joint change of the cervical spine. There is mild biapical pulmonary parenchymal scarring.  IMPRESSION: 1. The submandibular glands remain enlarged and inflamed. The microabscesses are more prominent today with some measuring over 1 cm in diameter. 2. There is no lymphadenopathy nor evidence of inflammatory change elsewhere within the neck.   Electronically Signed   By: David  [place]   On: 09/19/2013 10:14   Ct Soft Tissue Neck W Contrast  09/16/2013   CLINICAL DATA:  Lymphadenopathy. Elevated white blood count. Afebrile  EXAM: CT NECK WITH CONTRAST  TECHNIQUE: Multidetector CT imaging of the neck was performed using the standard protocol following the bolus administration of intravenous  contrast.  CONTRAST:  OMNIPAQUE IOHEXOL 300 MG/ML  SOLN  COMPARISON:  None.  FINDINGS: Image quality degraded by motion. The patient was not able to hold still for the scan.  Marked enlargement of the submandibular gland bilaterally which shows increased enhancement and surrounding subcutaneous and soft tissue edema. Multiple small ring-enhancing low-density fluid collections are present in the submandibular gland bilaterally, right greater than left suggesting micro abscesses.  The parotid gland is normal bilaterally. The tongue is normal. The pharynx is normal. There is no airway compromise. Larynx is normal.  The thyroid is normal in size.  Lung apices are clear.  Negative for pathologic adenopathy in the neck.  Moderate cervical spondylosis.  IMPRESSION: Marked enlargement of the submandibular gland bilaterally with increased enhancement and surrounding edema suggesting acute infection. Multiple small fluid collections are present in both submandibular glands suggesting multiple micro abscesses. These all measure under 1 cm.   Electronically Signed   By: Marlan Palau M.D.   On: 09/16/2013 20:39    Microbiology: Recent Results (from the past 240 hour(s))  URINE CULTURE     Status: None   Collection Time    09/17/13  3:01 PM      Result Value Ref Range Status   Specimen Description URINE, CLEAN CATCH   Final   Special Requests NONE   Final   Culture  Setup Time     Final   Value: 09/17/2013 19:19     Performed at [facility] Count     Final   Value: NO GROWTH     Performed at [facility]   Culture     Final   Value: NO GROWTH     Performed at [facility]   Report Status 09/18/2013 FINAL   Final  MRSA PCR SCREENING     Status: Abnormal   Collection Time    09/17/13 10:12 PM      Result Value Ref Range Status   MRSA by PCR POSITIVE (*) NEGATIVE Final   Comment:            The GeneXpert MRSA Assay (FDA     approved for NASAL specimens     only),  is one component of a     comprehensive MRSA colonization     surveillance program. It is not     intended to diagnose MRSA     infection nor to guide or     monitor treatment for     MRSA infections.     RESULT CALLED TO, READ BACK BY AND VERIFIED WITH:     BRALLEY,B RN AT 2334 07.22.15 BY TIBBITTS,K     Labs: Basic Metabolic Panel:  Recent Labs Lab  09/19/13 0051 09/19/13 0800 09/20/13 0020 09/21/13 0500 09/22/13 0032 09/23/13 0534  NA 141 140 141 145 150*  --   K 3.2* 5.3 4.6 4.0 3.7  --   CL 107 107 108 110 116*  --   CO2 22 19 24 21 23   --   GLUCOSE 83 80 137* 131* 107*  --   BUN 23 23 20  34* 39*  --   CREATININE 0.97 0.94 0.87 1.18* 1.17* 1.12*  CALCIUM 7.0* 7.1* 7.2* 7.9* 8.0*  --    Liver Function Tests:  Recent Labs Lab 09/16/13 1850 09/18/13 0040  AST 51* 58*  ALT 50* 44*  ALKPHOS 444* 437*  BILITOT 4.8* 4.3*  PROT 6.3 6.3  ALBUMIN 1.8* 1.7*   No results found for this basename: LIPASE, AMYLASE,  in the last 168 hours No results found for this basename: AMMONIA,  in the last 168 hours CBC:  Recent Labs Lab 09/16/13 1850 09/17/13 0750 09/18/13 0040 09/19/13 0051 09/20/13 0020 09/21/13 0500  WBC 12.5* 9.7 9.4 6.1 3.9* 8.3  NEUTROABS 10.5*  --   --   --   --   --   HGB 13.5 13.3 13.4 13.1 13.6 13.9  HCT 38.4 37.9 37.3 37.3 38.5 38.5  MCV 94.8 96.9 94.2 94.9 96.3 96.3  PLT 92* 73* 93* 73* 71* 78*   Cardiac Enzymes: No results found for this basename: CKTOTAL, CKMB, CKMBINDEX, TROPONINI,  in the last 168 hours BNP: BNP (last 3 results) No results found for this basename: PROBNP,  in the last 8760 hours CBG:  Recent Labs Lab 09/19/13 1654 09/19/13 1747  GLUCAP 63* 157*       Signed:  Ravenne Wayment  [facility] 09/23/2013, 12:53 PM

## 2013-09-23 NOTE — Progress Notes (Signed)
[facility] P/u by [facility]. Discharge to Hospice  with PIV and foley catheter in place.

## 2013-09-23 NOTE — Progress Notes (Signed)
Nutrition Brief Note   Chart reviewed. Pt now transitioning to comfort care.  No further nutrition interventions warranted at this time.  Please re-consult as needed.   Izora Benn F Adelyne Marchese MS RD LDN Clinical Dietitian Pager:[phone]    

## 2013-09-23 NOTE — Progress Notes (Signed)
Patient HQ:IONG:Sheri Stone      DOB: [birth year]      EXB:[id number]   [facility]    Subjective: Less moaning today, but still occasional. Unable to verbalize anything or provide any history. Frequent PRN morphine and haldol use.     Filed Vitals:   09/23/13 0414  BP: 138/80  Pulse: 95  Temp: 97.3 F (36.3 C)  Resp: 18   Physical exam: GEN: Confused, occaisonal moan but appears more comfortable.  HEENT: [place], mmm 2/2 excellent oral care by nursing  CV: RRR  LUNGS: scattered coarse breath sounds  EXT: lower ext edema  SKIN: warm/dry    Assessment and plan: 80 yo female with advanced dementia presenting with submandibular abscess, UTI, encephalopathy  1. Code Status: DNR, discussed with both son Sheri ClicheBobby Youth worker(HCPOA) and dghtr Sheri Stone who is at bedside. OOH DNR signed and in paper chart.   2.Goals of Care: See initial consult note as well as documentation from yesterday.  To pursue residential hospice care. Awaiting approval from hospice agency. Goal of care is comfort. Stop abx at discharge.   3. Symptom Management:  1. Anxiety/Agitation/Delirium: Continue haldol PRN and scheduled 2mg  q6h.  2. Pain: Using frequent PRN doses. Will rotate to dilaudid with some renal insufficiency. Schedule dilauddi 0.5mg  q6h and allow PRN  3. Bowel Regimen: not able to take oral meds. PRN suppository 4. Dysphagia- Family agrees we should not place short or long term feeding tube.         4. Psychosocial: [facility] prior to this admission. Before that was at [facility]. Daughter/Son are main caregivers.   Orvis BrillAaron J. Jamileth Putzier D.O. Palliative Medicine Team at [facility]  Pager: [phone] Team Phone: [phone]

## 2013-10-28 DEATH — deceased

## 2015-04-25 IMAGING — CT CT NECK W/ CM
2 series · 10 of 14 positions shown, 12 images · IV contrast (OMNIPAQUE 300)
Comparison: None.

CLINICAL DATA: Lymphadenopathy. Elevated white blood count.
Afebrile

EXAM:
CT NECK WITH CONTRAST
TECHNIQUE: Multidetector CT imaging of the neck was performed using the
standard protocol following the bolus administration of intravenous
contrast.
CONTRAST:  100mL OMNIPAQUE IOHEXOL 300 MG/ML  SOLN

[Series 3: neck with st · axial · 0.32mm/px · z∈[-712,-610]mm · 5 of 77 slices shown]
[im 13/77  bone]
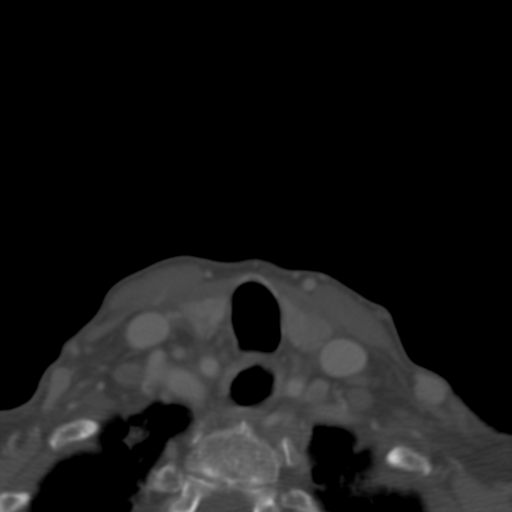
[im 26/77  bone]
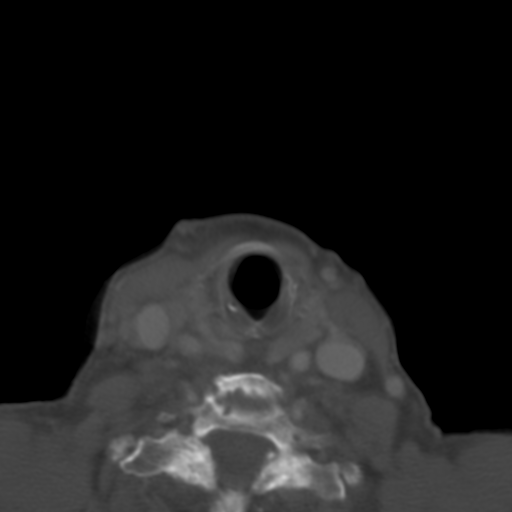
[im 39/77  bone]
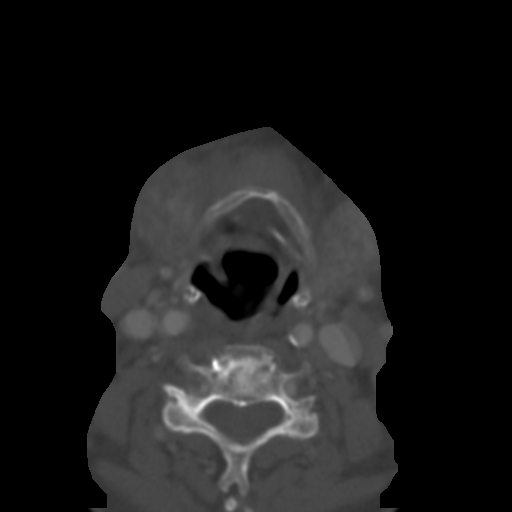
[im 51/77  bone]
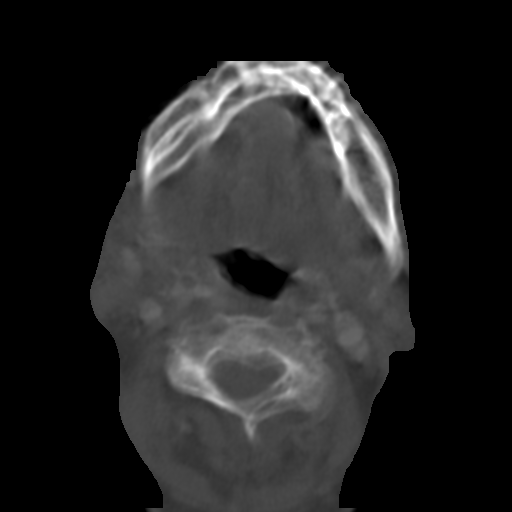
[im 64/77  bone]
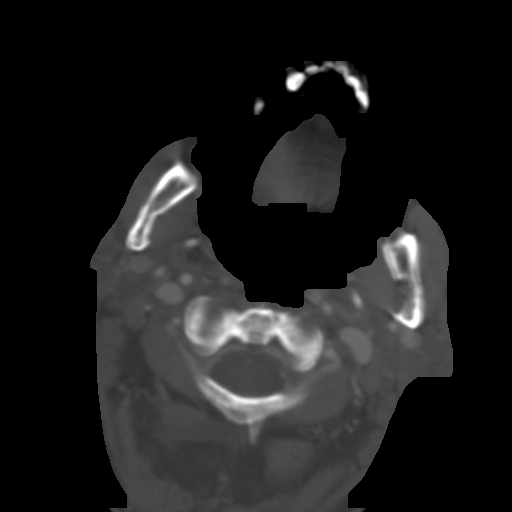

[Series 6: axial recons · axial · 0.25mm/px · z∈[-728,-627]mm · 5 of 77 slices shown, 7 images]
[im 13/77  soft-tissue]
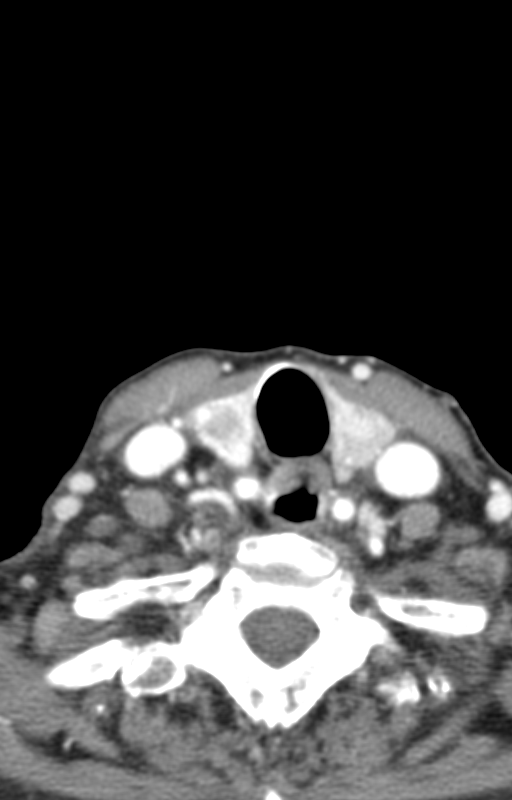
[im 13/77  bone]
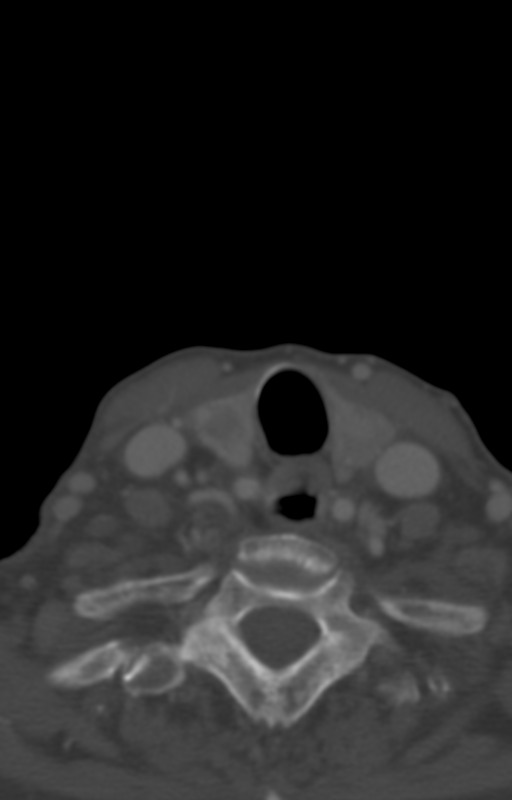
[im 26/77  bone]
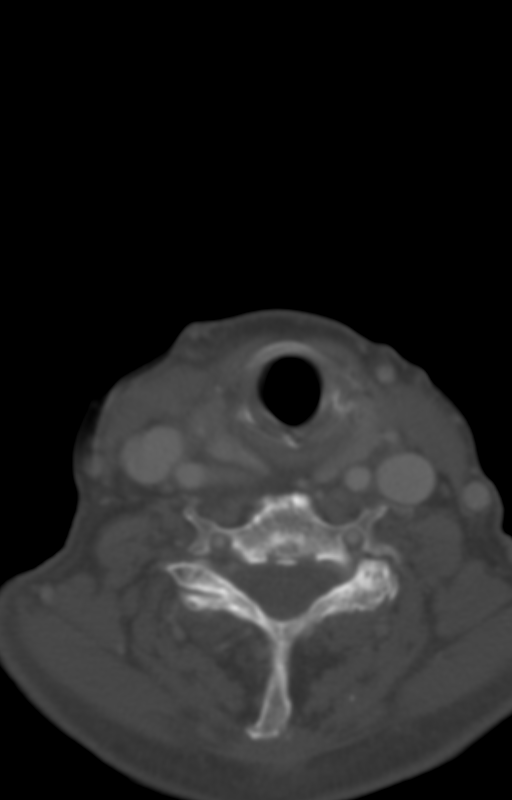
[im 39/77  bone]
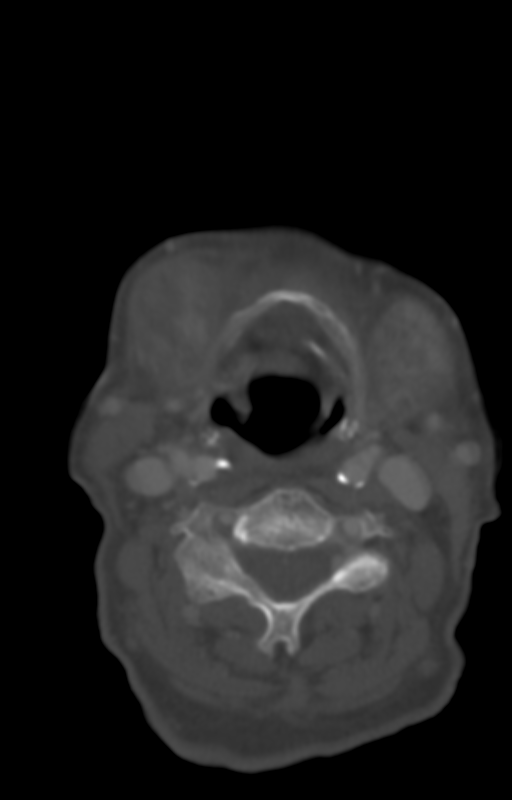
[im 51/77  bone]
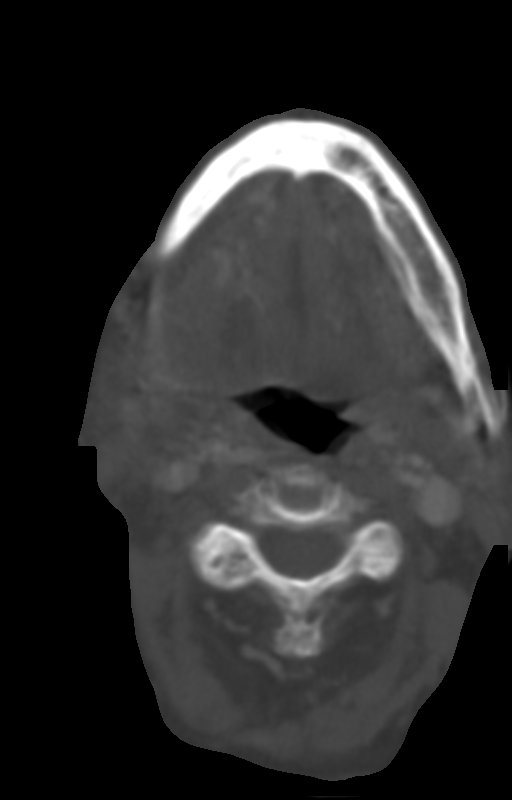
[im 64/77  soft-tissue]
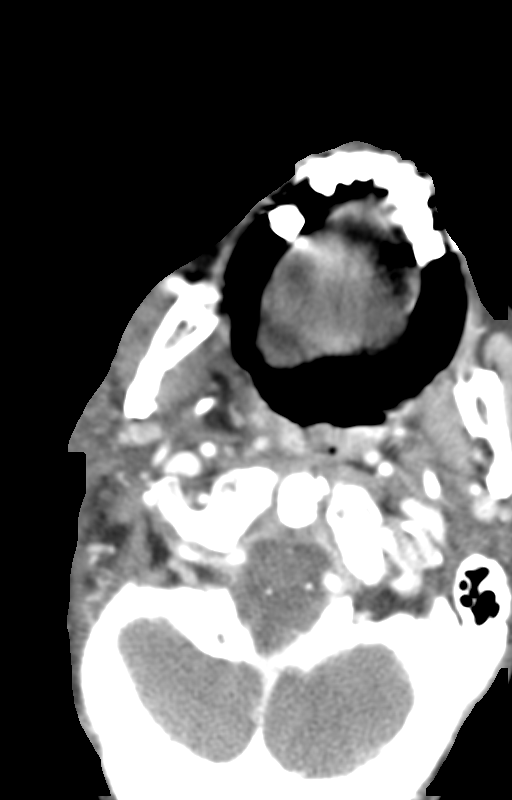
[im 64/77  bone]
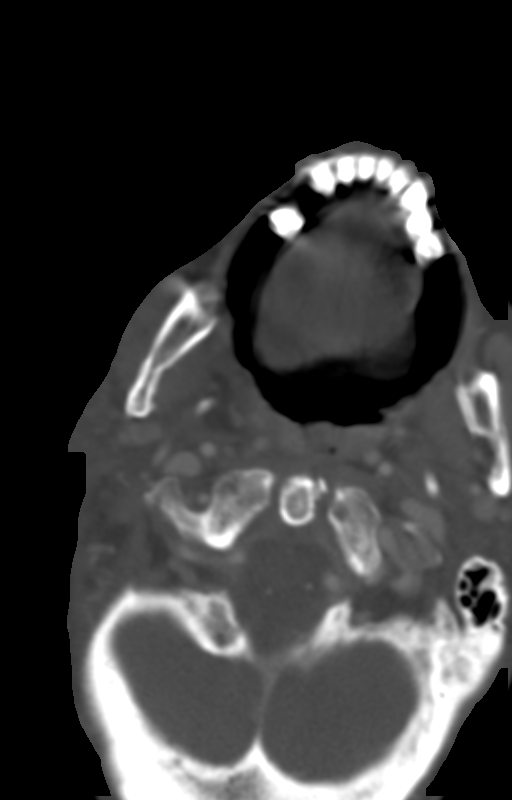

[10 of 14 positions shown; findings below may reference images not displayed]

FINDINGS: Image quality degraded by motion. The patient was not able to hold
still for the scan.

Marked enlargement of the submandibular gland bilaterally which
shows increased enhancement and surrounding subcutaneous and soft
tissue edema. Multiple small ring-enhancing low-density fluid
collections are present in the submandibular gland bilaterally,
right greater than left suggesting micro abscesses.

The parotid gland is normal bilaterally. The tongue is normal. The
pharynx is normal. There is no airway compromise. Larynx is normal.

The thyroid is normal in size.  Lung apices are clear.

Negative for pathologic adenopathy in the neck.

Moderate cervical spondylosis.
IMPRESSION: Marked enlargement of the submandibular gland bilaterally with
increased enhancement and surrounding edema suggesting acute
infection. Multiple small fluid collections are present in both
submandibular glands suggesting multiple micro abscesses. These all
measure under 1 cm.
